# Patient Record
Sex: Male | Born: 1975
Health system: Southern US, Community
[De-identification: ages and names within clinical notes are randomized; demographics above are authoritative.]

## PROBLEM LIST (undated history)

## (undated) DIAGNOSIS — I10 Essential (primary) hypertension: Secondary | ICD-10-CM

## (undated) HISTORY — PX: APPENDECTOMY: SHX54

---

## 2005-10-11 ENCOUNTER — Emergency Department: Payer: Self-pay | Admitting: Emergency Medicine

## 2007-05-17 ENCOUNTER — Ambulatory Visit: Payer: Self-pay | Admitting: Family Medicine

## 2014-04-10 ENCOUNTER — Ambulatory Visit: Payer: Self-pay | Admitting: Surgery

## 2014-06-11 LAB — SURGICAL PATHOLOGY

## 2014-06-17 NOTE — Op Note (Signed)
PATIENT NAME:  Duane Thompson, Aylan W MR#:  [id number] DATE OF BIRTH:  1976-01-20  DATE OF PROCEDURE:  04/10/2014  PREOPERATIVE DIAGNOSIS: Acute appendicitis.   POSTOPERATIVE DIAGNOSIS: Acute appendicitis.   PROCEDURE PERFORMED: Laparoscopic appendectomy.   ANESTHESIA: General.   ESTIMATED BLOOD LOSS: 25 mL.   COMPLICATIONS: None.   SPECIMEN: Appendix.   INDICATION FOR SURGERY: Mr. Marissa CalamityBallard is a pleasant, 39 year old male, who presents with acute onset of right lower quadrant pain and a CT scan concerning for acute appendicitis. He was brought to the operating room for a laparoscopic appendectomy.   DETAILS OF PROCEDURE AS FOLLOWS: Informed consent was obtained. Mr. Marissa CalamityBallard was brought to the operating room and his abdomen was prepped and draped in standard sterile fashion. A timeout was then performed correctly identifying patient name, operative site and procedure to be performed.   A transverse supraumbilical incision was made and deepened down to the fascia. The fascia was incised and two stay sutures were placed in the fasciotomy. A Hasson trocar was placed in the abdomen and the abdomen was insufflated. Under direct vision, a right lower quadrant and suprapubic, 5 mm trocars were placed. The appendix was visualized. It was visibly inflamed and also scarred to the abdominal wall, and adhesions were taken down and the right colon was mobilized slightly until I could place a [place] dissector through the mesoappendix at the base of the colon. I then transected the colon with the endoscopic stapler. I then went across the mesoappendix with 2 staple loads. The appendix was then brought out through an Endo Catch bag through the umbilical incision. I then obtained hemostasis carefully.   After adequate hemostasis, the abdomen was desufflated. All trocars were taken out under direct visualization, and the supraumbilical fascia was closed with figure-of-eight 0 Vicryl. All port sites were closed with  interrupted 4-0 Monocryl and deep dermal sutures. Steri-Strips, Telfa and Tegaderm were used to complete the dressing. The patient was then awoken, extubated and brought to the postanesthesia care unit. There were no immediate complications.   Needle, sponge, and instrument counts were correct at the end of the procedure.    ____________________________ Si Raiderhristopher A. Chanae Gemma, MD cal:JT D: 04/11/2014 21:27:00 ET T: 04/12/2014 12:30:47 ET JOB#: [id number]  cc: Cristal Deerhristopher A. Archer Moist, MD, <Dictator> Jarvis NewcomerHRISTOPHER A Anetta Olvera MD ELECTRONICALLY SIGNED 04/24/2014 11:25

## 2014-06-17 NOTE — H&P (Signed)
   Subjective/Chief Complaint RLQ pain x 1 day, anorexia   History of Present Illness Mr. Duane Thompson is a pleasant 39 yo M who presents with 1 day of RLQ pain.  Began acutely.  Had episode of appendicitis in 2009 which was treated successfully with antibiotics.  Pain began initially diffusely, now in RLQ.  + fevers/chills.  + nausea, regular BM.   Past History H/o vasectomy H/o orthopedic surgeries   Code Status Full Code   ALLERGIES:  Amoxicillin: Hives  Family and Social History:  Family History Coronary Artery Disease  Hypertension  Diabetes Mellitus  Cancer   Social History negative tobacco, positive ETOH, negative Illicit drugs, Social EtOH   Place of Living Home   Review of Systems:  Subjective/Chief Complaint RLQ pain, fevers/chills   Fever/Chills Yes   Cough No   Sputum No   Abdominal Pain Yes   Diarrhea No   Constipation No   Nausea/Vomiting Yes   SOB/DOE No   Chest Pain No   Dysuria No   Tolerating Diet Yes  Nauseated  + anorexia   Physical Exam:  GEN well developed, well nourished, no acute distress   HEENT pink conjunctivae, PERRL, hearing intact to voice, good dentition   RESP normal resp effort  clear BS  no use of accessory muscles   CARD regular rate  no murmur  no thrills   ABD positive tenderness  no hernia  soft  normal BS  + RLQ tenderness   EXTR negative cyanosis/clubbing, negative edema   SKIN normal to palpation, No rashes, No ulcers   NEURO cranial nerves intact, negative rigidity, negative tremor, follows commands, strength:, motor/sensory function intact   PSYCH A+O to time, place, person, good insight    Assessment/Admission Diagnosis RLQ pain, fevers,chills, anorexia.  Clinically and radiographic appendicitis.   Plan NPO, IV abx.  Plan on laparoscopic appendectomy.  Have discussed with patient who would like to proceed.   Electronic Signatures: Jarvis NewcomerLundquist, Mickael Mcnutt A (MD)  (Signed [phone]-Feb-16 17:15)  Authored: CHIEF  COMPLAINT and HISTORY, ALLERGIES, FAMILY AND SOCIAL HISTORY, REVIEW OF SYSTEMS, PHYSICAL EXAM, ASSESSMENT AND PLAN   Last Updated: 23-Feb-16 17:15 by Jarvis NewcomerLundquist, Joanny Dupree A (MD)

## 2016-01-07 IMAGING — CT CT ABD-PELV W/ CM
2 of 4 series · 16 of 46 positions shown, 18 images · IV contrast (omnipaque)
Comparison: None

CLINICAL DATA: Severe right lower quadrant pain for 1 day

EXAM:
CT ABDOMEN AND PELVIS WITH CONTRAST
TECHNIQUE: Multidetector CT imaging of the abdomen and pelvis was performed
using the standard protocol following bolus administration of
intravenous contrast.
CONTRAST:  100 cc Omnipaque 300

[Series 2: routine abd pel with · axial · 0.70mm/px · z∈[-503,-23]mm · 13 of 106 slices shown, 15 images]
[im 5/106  soft-tissue]
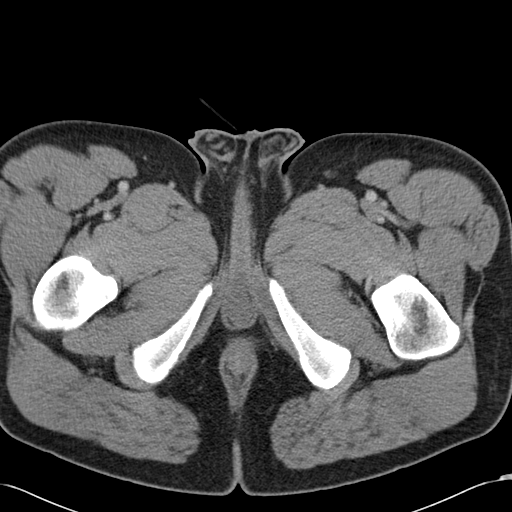
[im 5/106  bone]
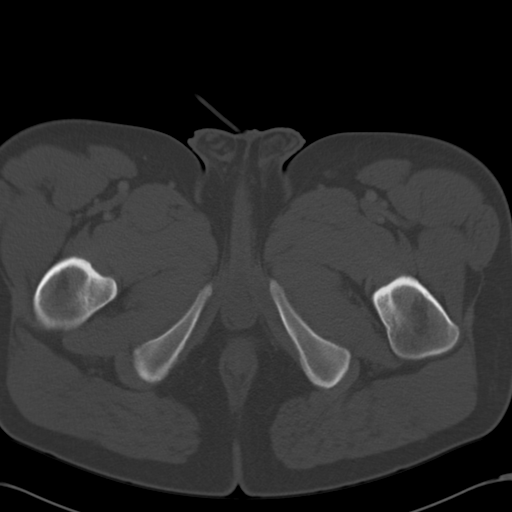
[im 13/106  soft-tissue]
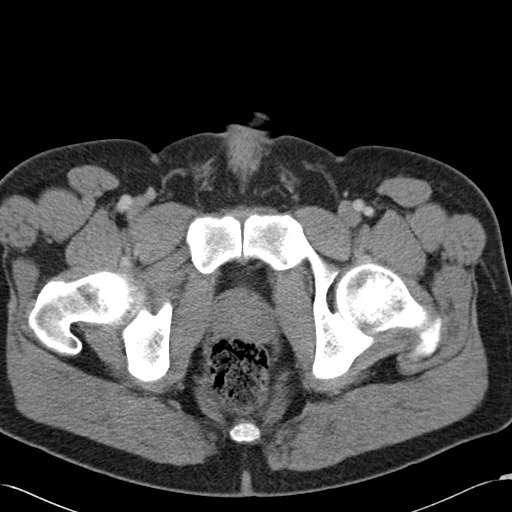
[im 22/106  soft-tissue]
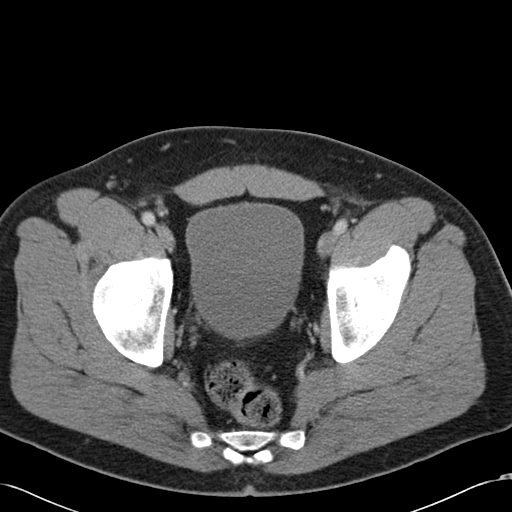
[im 30/106  soft-tissue]
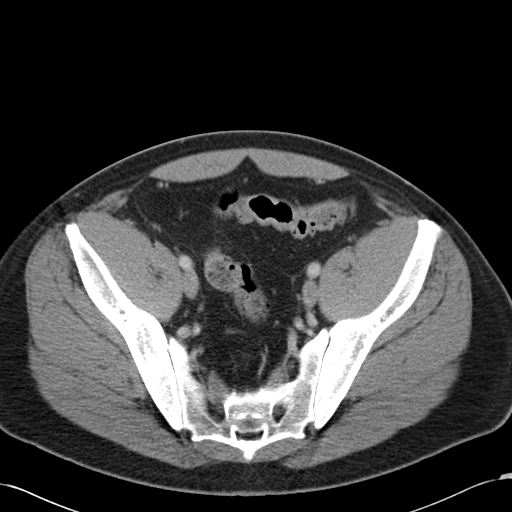
[im 38/106  soft-tissue]
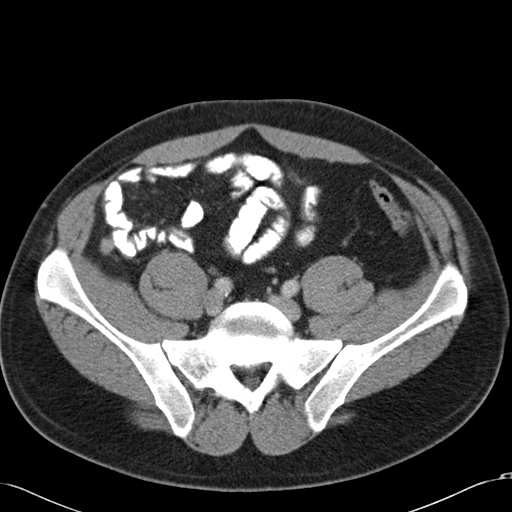
[im 47/106  soft-tissue]
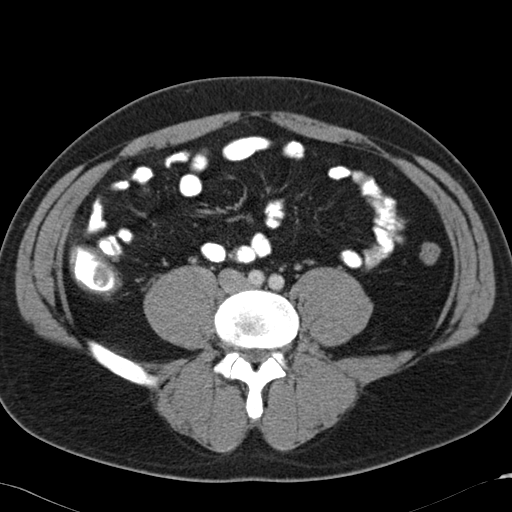
[im 55/106  soft-tissue]
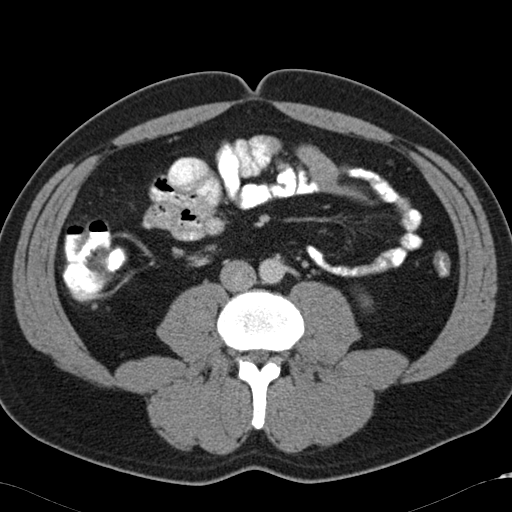
[im 59/106  soft-tissue]
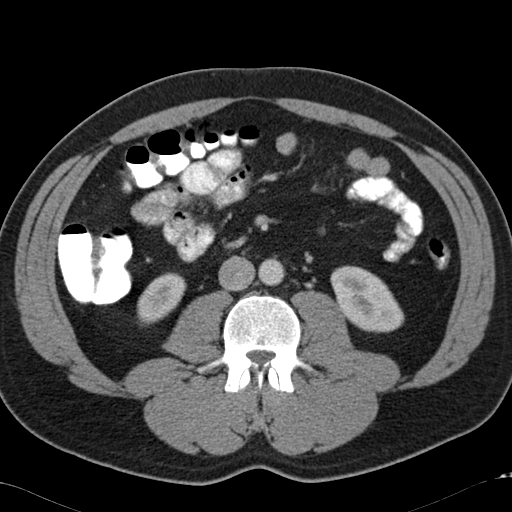
[im 68/106  soft-tissue]
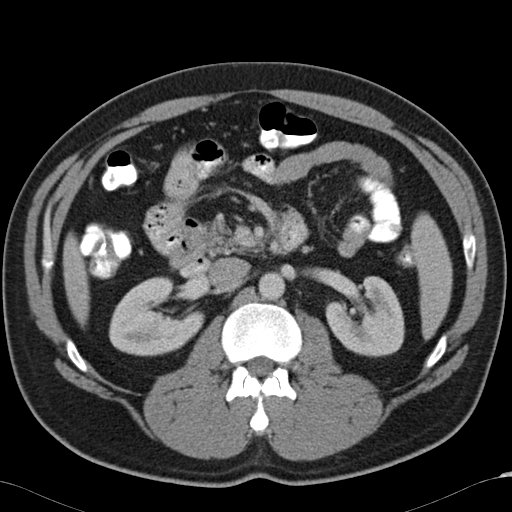
[im 68/106  bone]
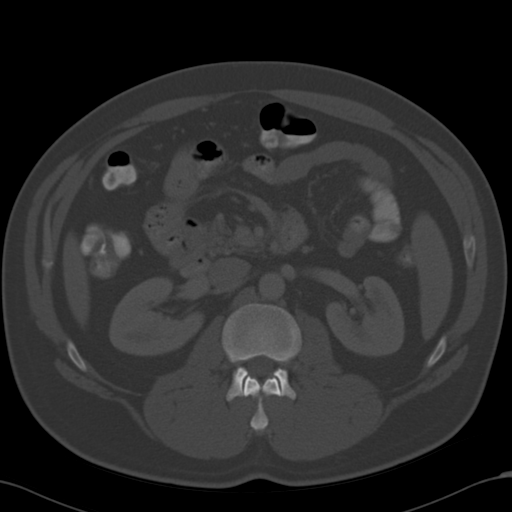
[im 76/106  soft-tissue]
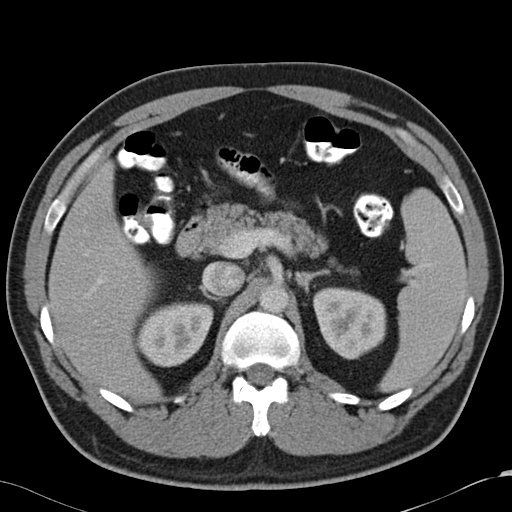
[im 85/106  soft-tissue]
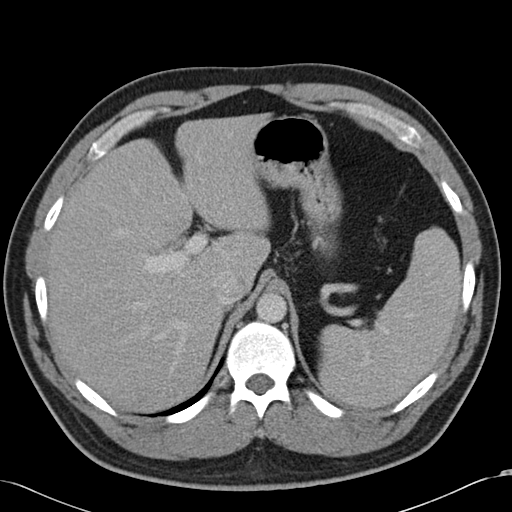
[im 93/106  soft-tissue]
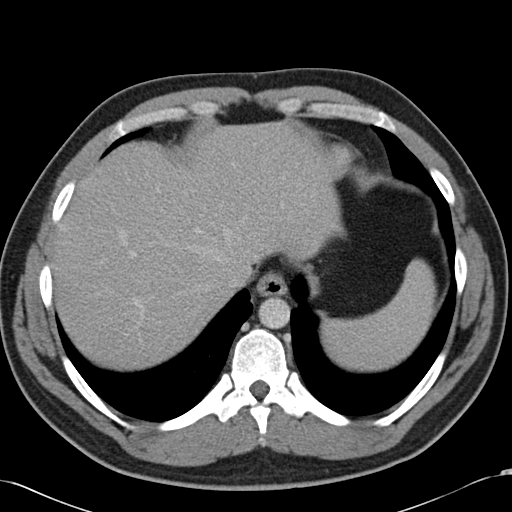
[im 101/106  soft-tissue]
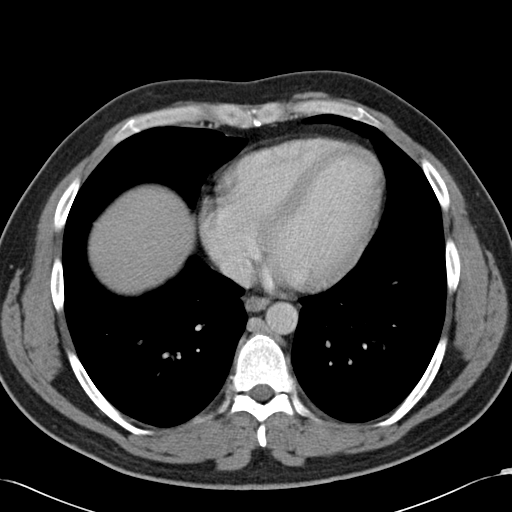

[Series 5: cor routine abd pel with · coronal · 0.71mm/px · 3 of 141 slices shown]
[im 47/141  soft-tissue]
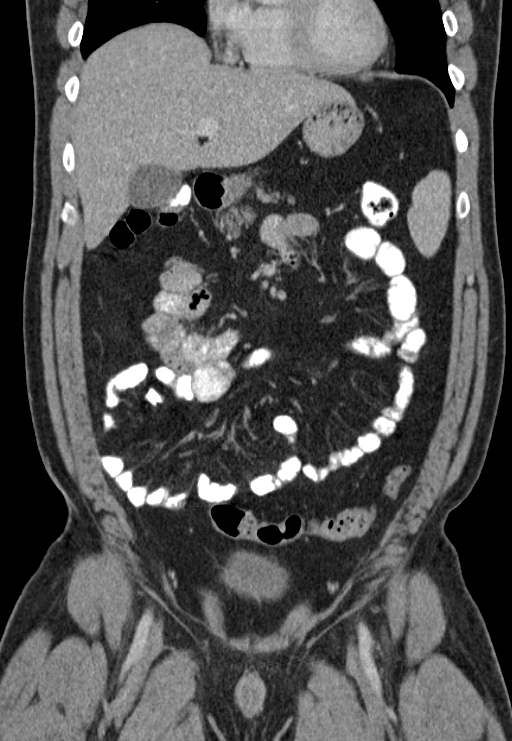
[im 63/141  soft-tissue]
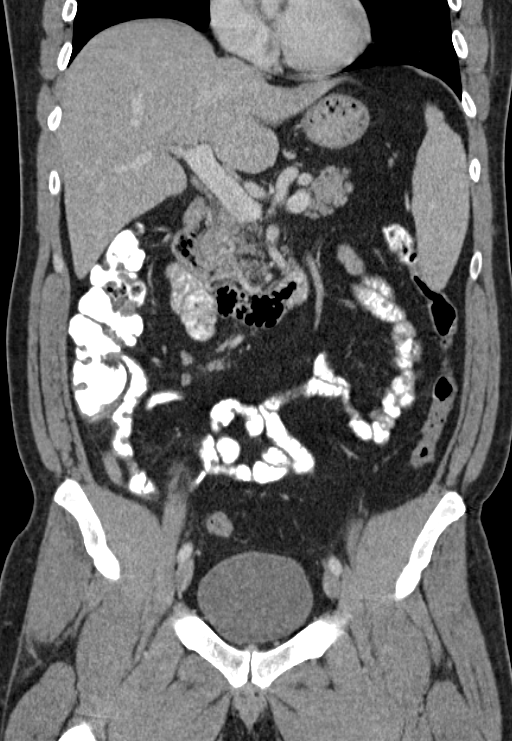
[im 78/141  soft-tissue]
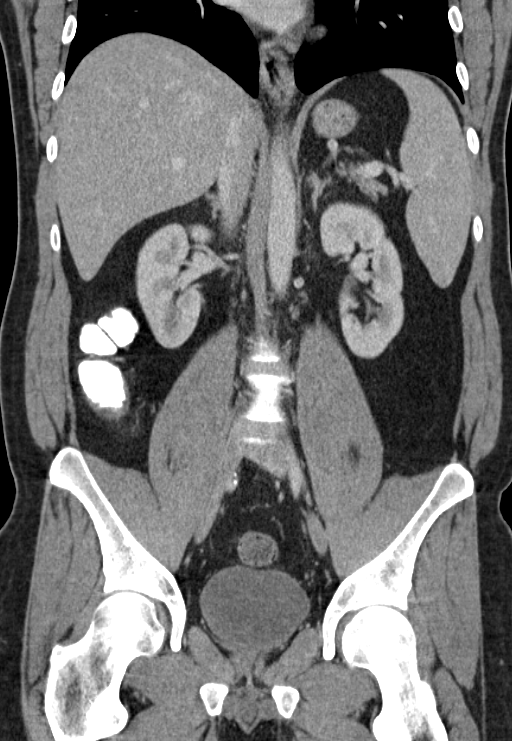

[16 of 46 positions shown; findings below may reference images not displayed]

FINDINGS: The lung bases are clear. The liver enhances with no focal
abnormality and no ductal dilatation is seen. No calcified
gallstones are seen. The pancreas is normal in size in the
pancreatic duct is not dilated. The adrenal glands and spleen are
unremarkable. The stomach is decompressed. The kidneys enhance with
no mass or hydronephrosis and no calculi are seen. The abdominal
aorta is normal in caliber. No adenopathy is noted.

The appendix is prominent measuring up to 11 mm in diameter
proximally. There is some strandiness of the perippendiceal fat
planes, and these findings are consistent with early appendicitis.
No complicating features are noted. There is some edema of the base
of the cecum as well at the orifice of the appendix. The terminal
ileum is unremarkable.

The urinary bladder is moderately urine distended with no
abnormality noted. The prostate is normal in size. No free fluid is
seen within the pelvis. The lumbar vertebrae are normal alignment.
IMPRESSION: 1. Prominent appendix measuring up to 11 mm in diameter with mild
strandiness of the periappendiceal fat planes, consistent with mild
early appendicitis. No complicating features are noted.

## 2018-06-16 ENCOUNTER — Other Ambulatory Visit: Payer: Self-pay

## 2018-06-16 ENCOUNTER — Ambulatory Visit (HOSPITAL_COMMUNITY)
Admission: EM | Admit: 2018-06-16 | Discharge: 2018-06-16 | Disposition: A | Discharge status: Home / Self Care | Payer: 59 | Attending: Orthopedic Surgery | Admitting: Orthopedic Surgery

## 2018-06-16 ENCOUNTER — Ambulatory Visit: Admit: 2018-06-16 | Payer: 59 | Admitting: Orthopedic Surgery

## 2018-06-16 ENCOUNTER — Encounter (HOSPITAL_COMMUNITY): Payer: Self-pay | Admitting: Anesthesiology

## 2018-06-16 ENCOUNTER — Emergency Department (HOSPITAL_COMMUNITY): Payer: 59 | Admitting: Anesthesiology

## 2018-06-16 ENCOUNTER — Ambulatory Visit
Admission: EM | Admit: 2018-06-16 | Discharge: 2018-06-16 | Disposition: A | Discharge status: Other Acute Inpatient Hospital | Payer: 59

## 2018-06-16 ENCOUNTER — Emergency Department (HOSPITAL_COMMUNITY): Payer: 59

## 2018-06-16 ENCOUNTER — Encounter (HOSPITAL_COMMUNITY): Admission: EM | Disposition: A | Discharge status: Home / Self Care | Payer: Self-pay | Source: Home / Self Care

## 2018-06-16 DIAGNOSIS — S68610A Complete traumatic transphalangeal amputation of right index finger, initial encounter: Secondary | ICD-10-CM | POA: Diagnosis not present

## 2018-06-16 DIAGNOSIS — W230XXA Caught, crushed, jammed, or pinched between moving objects, initial encounter: Secondary | ICD-10-CM | POA: Insufficient documentation

## 2018-06-16 DIAGNOSIS — Z880 Allergy status to penicillin: Secondary | ICD-10-CM | POA: Insufficient documentation

## 2018-06-16 DIAGNOSIS — S68119A Complete traumatic metacarpophalangeal amputation of unspecified finger, initial encounter: Secondary | ICD-10-CM

## 2018-06-16 HISTORY — PX: I & D EXTREMITY: SHX5045

## 2018-06-16 SURGERY — IRRIGATION AND DEBRIDEMENT EXTREMITY
Anesthesia: General | Site: Finger | Laterality: Right

## 2018-06-16 MED ORDER — CEFAZOLIN SODIUM-DEXTROSE 2-4 GM/100ML-% IV SOLN: 2.0000 g | INTRAVENOUS | Status: DC

## 2018-06-16 MED ORDER — EPHEDRINE 5 MG/ML INJ
INTRAVENOUS | Status: AC
  Filled 2018-06-16: qty 10

## 2018-06-16 MED ORDER — ONDANSETRON HCL 4 MG/2ML IJ SOLN: 4.0000 mg | Freq: Once | INTRAMUSCULAR | Status: DC

## 2018-06-16 MED ORDER — SULFAMETHOXAZOLE-TRIMETHOPRIM 800-160 MG PO TABS: 1.0000 | ORAL_TABLET | Freq: Two times a day (BID) | ORAL | 0 refills | Status: DC

## 2018-06-16 MED ORDER — CEFAZOLIN SODIUM-DEXTROSE 1-4 GM/50ML-% IV SOLN
1.0000 g | Freq: Once | INTRAVENOUS | Status: AC
  Administered 2018-06-16: 17:00:00 1 g via INTRAVENOUS
  Filled 2018-06-16: qty 50

## 2018-06-16 MED ORDER — MIDAZOLAM HCL 2 MG/2ML IJ SOLN
INTRAMUSCULAR | Status: AC
  Filled 2018-06-16: qty 2

## 2018-06-16 MED ORDER — 0.9 % SODIUM CHLORIDE (POUR BTL) OPTIME
TOPICAL | Status: DC | PRN
  Administered 2018-06-16 (×2): 1000 mL

## 2018-06-16 MED ORDER — CEFAZOLIN SODIUM-DEXTROSE 1-4 GM/50ML-% IV SOLN
INTRAVENOUS | Status: DC | PRN
  Administered 2018-06-16: 1 g via INTRAVENOUS

## 2018-06-16 MED ORDER — ARTIFICIAL TEARS OPHTHALMIC OINT
TOPICAL_OINTMENT | OPHTHALMIC | Status: AC
  Filled 2018-06-16: qty 3.5

## 2018-06-16 MED ORDER — PHENYLEPHRINE 40 MCG/ML (10ML) SYRINGE FOR IV PUSH (FOR BLOOD PRESSURE SUPPORT)
PREFILLED_SYRINGE | INTRAVENOUS | Status: AC
  Filled 2018-06-16: qty 10

## 2018-06-16 MED ORDER — PROPOFOL 10 MG/ML IV BOLUS
INTRAVENOUS | Status: DC | PRN
  Administered 2018-06-16: 200 mg via INTRAVENOUS

## 2018-06-16 MED ORDER — BUPIVACAINE HCL (PF) 0.25 % IJ SOLN
INTRAMUSCULAR | Status: DC | PRN
  Administered 2018-06-16: 10 mL

## 2018-06-16 MED ORDER — FENTANYL CITRATE (PF) 250 MCG/5ML IJ SOLN
INTRAMUSCULAR | Status: DC | PRN
  Administered 2018-06-16 (×4): 50 ug via INTRAVENOUS

## 2018-06-16 MED ORDER — HYDROCODONE-ACETAMINOPHEN 5-325 MG PO TABS: ORAL_TABLET | ORAL | 0 refills | Status: DC

## 2018-06-16 MED ORDER — TETANUS-DIPHTH-ACELL PERTUSSIS 5-2.5-18.5 LF-MCG/0.5 IM SUSP
0.5000 mL | Freq: Once | INTRAMUSCULAR | Status: AC
  Administered 2018-06-16: 0.5 mL via INTRAMUSCULAR
  Filled 2018-06-16: qty 0.5

## 2018-06-16 MED ORDER — BUPIVACAINE HCL 0.25 % IJ SOLN
10.0000 mL | Freq: Once | INTRAMUSCULAR | Status: AC
  Administered 2018-06-16: 10 mL
  Filled 2018-06-16: qty 10

## 2018-06-16 MED ORDER — SUCCINYLCHOLINE CHLORIDE 200 MG/10ML IV SOSY
PREFILLED_SYRINGE | INTRAVENOUS | Status: AC
  Filled 2018-06-16: qty 20

## 2018-06-16 MED ORDER — FENTANYL CITRATE (PF) 100 MCG/2ML IJ SOLN: 50.0000 ug | Freq: Once | INTRAMUSCULAR | Status: DC

## 2018-06-16 MED ORDER — CEFAZOLIN SODIUM 1 G IJ SOLR
INTRAMUSCULAR | Status: AC
  Filled 2018-06-16: qty 20

## 2018-06-16 MED ORDER — BUPIVACAINE HCL (PF) 0.25 % IJ SOLN
INTRAMUSCULAR | Status: AC
  Filled 2018-06-16: qty 30

## 2018-06-16 MED ORDER — LIDOCAINE HCL (CARDIAC) PF 100 MG/5ML IV SOSY
PREFILLED_SYRINGE | INTRAVENOUS | Status: DC | PRN
  Administered 2018-06-16: 60 mg via INTRATRACHEAL

## 2018-06-16 MED ORDER — LIDOCAINE 2% (20 MG/ML) 5 ML SYRINGE
INTRAMUSCULAR | Status: AC
  Filled 2018-06-16: qty 10

## 2018-06-16 MED ORDER — PROPOFOL 10 MG/ML IV BOLUS
INTRAVENOUS | Status: AC
  Filled 2018-06-16: qty 40

## 2018-06-16 MED ORDER — ONDANSETRON HCL 4 MG/2ML IJ SOLN
INTRAMUSCULAR | Status: AC
  Filled 2018-06-16: qty 2

## 2018-06-16 MED ORDER — SODIUM CHLORIDE (PF) 0.9 % IJ SOLN
INTRAMUSCULAR | Status: AC
  Filled 2018-06-16: qty 10

## 2018-06-16 MED ORDER — FENTANYL CITRATE (PF) 250 MCG/5ML IJ SOLN
INTRAMUSCULAR | Status: AC
  Filled 2018-06-16: qty 5

## 2018-06-16 MED ORDER — POVIDONE-IODINE 10 % EX SWAB: 2.0000 "application " | Freq: Once | CUTANEOUS | Status: DC

## 2018-06-16 MED ORDER — FENTANYL CITRATE (PF) 100 MCG/2ML IJ SOLN: 25.0000 ug | INTRAMUSCULAR | Status: DC | PRN

## 2018-06-16 MED ORDER — SUCCINYLCHOLINE CHLORIDE 20 MG/ML IJ SOLN
INTRAMUSCULAR | Status: DC | PRN
  Administered 2018-06-16: 120 mg via INTRAVENOUS

## 2018-06-16 MED ORDER — LACTATED RINGERS IV SOLN
INTRAVENOUS | Status: DC | PRN
  Administered 2018-06-16: 21:00:00 via INTRAVENOUS

## 2018-06-16 MED ORDER — MIDAZOLAM HCL 5 MG/5ML IJ SOLN
INTRAMUSCULAR | Status: DC | PRN
  Administered 2018-06-16: 2 mg via INTRAVENOUS

## 2018-06-16 MED ORDER — CHLORHEXIDINE GLUCONATE 4 % EX LIQD
60.0000 mL | Freq: Once | CUTANEOUS | Status: DC
  Filled 2018-06-16: qty 60

## 2018-06-16 MED ORDER — SODIUM CHLORIDE 0.9 % IR SOLN
Status: DC | PRN
  Administered 2018-06-16: 1000 mL

## 2018-06-16 SURGICAL SUPPLY — 51 items
BANDAGE ACE 3X5.8 VEL STRL LF (GAUZE/BANDAGES/DRESSINGS) ×3 IMPLANT
BANDAGE ACE 4X5 VEL STRL LF (GAUZE/BANDAGES/DRESSINGS) IMPLANT
BANDAGE COBAN LF 1.5X5 NS (GAUZE/BANDAGES/DRESSINGS) ×3 IMPLANT
BANDAGE COBAN STERILE 2 (GAUZE/BANDAGES/DRESSINGS) IMPLANT
BNDG ESMARK 4X9 LF (GAUZE/BANDAGES/DRESSINGS) ×3 IMPLANT
BNDG GAUZE ELAST 4 BULKY (GAUZE/BANDAGES/DRESSINGS) ×3 IMPLANT
CORDS BIPOLAR (ELECTRODE) ×3 IMPLANT
COVER SURGICAL LIGHT HANDLE (MISCELLANEOUS) ×3 IMPLANT
COVER WAND RF STERILE (DRAPES) ×3 IMPLANT
CUFF TOURNIQUET SINGLE 18IN (TOURNIQUET CUFF) ×3 IMPLANT
CUFF TOURNIQUET SINGLE 24IN (TOURNIQUET CUFF) IMPLANT
DECANTER SPIKE VIAL GLASS SM (MISCELLANEOUS) ×3 IMPLANT
DRAIN PENROSE 1/4X12 LTX STRL (WOUND CARE) IMPLANT
DRSG PAD ABDOMINAL 8X10 ST (GAUZE/BANDAGES/DRESSINGS) ×6 IMPLANT
DRSG XEROFORM 1X8 (GAUZE/BANDAGES/DRESSINGS) ×3 IMPLANT
FACESHIELD WRAPAROUND (MASK) ×3 IMPLANT
GAUZE SPONGE 4X4 12PLY STRL (GAUZE/BANDAGES/DRESSINGS) ×3 IMPLANT
GAUZE XEROFORM 1X8 LF (GAUZE/BANDAGES/DRESSINGS) ×3 IMPLANT
GLOVE BIO SURGEON STRL SZ7.5 (GLOVE) ×3 IMPLANT
GLOVE BIOGEL PI IND STRL 8 (GLOVE) ×1 IMPLANT
GLOVE BIOGEL PI INDICATOR 8 (GLOVE) ×2
GOWN STRL REUS W/ TWL LRG LVL3 (GOWN DISPOSABLE) ×1 IMPLANT
GOWN STRL REUS W/ TWL XL LVL3 (GOWN DISPOSABLE) ×1 IMPLANT
GOWN STRL REUS W/TWL LRG LVL3 (GOWN DISPOSABLE) ×2
GOWN STRL REUS W/TWL XL LVL3 (GOWN DISPOSABLE) ×2
KIT BASIN OR (CUSTOM PROCEDURE TRAY) ×3 IMPLANT
KIT TURNOVER KIT B (KITS) ×3 IMPLANT
LOOP VESSEL MAXI BLUE (MISCELLANEOUS) IMPLANT
MANIFOLD NEPTUNE II (INSTRUMENTS) ×3 IMPLANT
NEEDLE HYPO 25X1 1.5 SAFETY (NEEDLE) ×3 IMPLANT
NS IRRIG 1000ML POUR BTL (IV SOLUTION) ×3 IMPLANT
PACK ORTHO EXTREMITY (CUSTOM PROCEDURE TRAY) ×3 IMPLANT
PAD ARMBOARD 7.5X6 YLW CONV (MISCELLANEOUS) ×6 IMPLANT
SCRUB BETADINE 4OZ XXX (MISCELLANEOUS) ×3 IMPLANT
SET CYSTO W/LG BORE CLAMP LF (SET/KITS/TRAYS/PACK) IMPLANT
SOL PREP POV-IOD 4OZ 10% (MISCELLANEOUS) ×3 IMPLANT
SPLINT FINGER W/BULB (SOFTGOODS) ×3 IMPLANT
SPONGE LAP 4X18 RFD (DISPOSABLE) IMPLANT
SUT CHROMIC 5 0 P 3 (SUTURE) ×3 IMPLANT
SUT ETHILON 4 0 P 3 18 (SUTURE) IMPLANT
SUT ETHILON 4 0 PS 2 18 (SUTURE) IMPLANT
SUT MON AB 5-0 P3 18 (SUTURE) ×3 IMPLANT
SWAB COLLECTION DEVICE MRSA (MISCELLANEOUS) IMPLANT
SWAB CULTURE ESWAB REG 1ML (MISCELLANEOUS) IMPLANT
SYR CONTROL 10ML LL (SYRINGE) ×3 IMPLANT
TOWEL OR 17X26 10 PK STRL BLUE (TOWEL DISPOSABLE) ×3 IMPLANT
TUBE CONNECTING 12'X1/4 (SUCTIONS) ×1
TUBE CONNECTING 12X1/4 (SUCTIONS) ×2 IMPLANT
TUBE FEEDING ENTERAL 5FR 16IN (TUBING) IMPLANT
UNDERPAD 30X30 (UNDERPADS AND DIAPERS) ×3 IMPLANT
YANKAUER SUCT BULB TIP NO VENT (SUCTIONS) ×3 IMPLANT

## 2018-06-16 NOTE — Transfer of Care (Signed)
Immediate Anesthesia Transfer of Care Note  Patient: Duane Thompson  Procedure(s) Performed: IRRIGATION AND DEBRIDEMENT AND REVISION OF PARTIAL  AMPUTATION  RIGHT INDEX FINGER (Right Finger)  Patient Location: PACU  Anesthesia Type:General  Level of Consciousness: awake  Airway & Oxygen Therapy: Patient Spontanous Breathing and Patient connected to face mask oxygen  Post-op Assessment: Report given to RN and Post -op Vital signs reviewed and stable  Post vital signs: Reviewed and stable  Last Vitals:  Vitals Value Taken Time  BP    Temp    Pulse 110 06/16/2018  9:52 PM  Resp 16 06/16/2018  9:52 PM  SpO2 99 % 06/16/2018  9:52 PM  Vitals shown include unvalidated device data.  Last Pain:  Vitals:   06/16/18 1914  TempSrc: Oral  PainSc:          Complications: No apparent anesthesia complications

## 2018-06-16 NOTE — ED Provider Notes (Signed)
[facility] EMERGENCY DEPARTMENT Provider Note   CSN: [id number] Arrival date & time: 06/16/18  1419    History   Chief Complaint Chief Complaint  Patient presents with  . Finger Injury    HPI Duane Thompson is a 43 y.o. male.     The history is provided by the patient and medical records.  Hand Injury  Location:  Finger Finger location:  R index finger Injury: yes (full distal amputation)   Time since incident:  2 hours Mechanism of injury: amputation   Amputation:    Extent:  Complete   Mechanism:  Guillotine   Cause: trailer attachement mechanism.   Body part recovered: no     Reported condition of body part:  Unusable Pain details:    Quality:  Aching, throbbing and burning   Radiates to:  Does not radiate   Severity:  Severe   Onset quality:  Sudden   Duration:  2 hours   Timing:  Constant   Progression:  Unchanged Handedness:  Left-handed Foreign body present:  No foreign bodies Tetanus status:  Out of date Prior injury to area:  No Relieved by:  Nothing Worsened by:  Nothing Ineffective treatments:  None tried Associated symptoms: no back pain, no decreased range of motion, no fatigue, no fever, no muscle weakness, no neck pain, no numbness, no stiffness, no swelling and no tingling   Risk factors: no concern for non-accidental trauma, no known bone disorder, no frequent fractures and no recent illness     No past medical history on file.  There are no active problems to display for this patient.     The histories are not reviewed yet. Please review them in the "History" navigator section and refresh this SmartLink.      Home Medications    Prior to Admission medications   Not on File    Family History No family history on file.  Social History Social History   Tobacco Use  . Smoking status: Not on file  Substance Use Topics  . Alcohol use: Not on file  . Drug use: Not on file     Allergies   Patient  has no known allergies.   Review of Systems Review of Systems  Constitutional: Negative for fatigue and fever.  HENT: Negative.   Eyes: Negative.   Respiratory: Negative.  Negative for shortness of breath.   Cardiovascular: Negative.   Gastrointestinal: Negative.   Endocrine: Negative.   Genitourinary: Negative.   Musculoskeletal: Negative for back pain, neck pain and stiffness.  Skin: Positive for wound.  Neurological: Negative.   Hematological: Negative.      Physical Exam Updated Vital Signs BP (!) 199/113 (BP Location: Left Arm)   Pulse 86   Temp 98.1 F (36.7 C) (Oral)   Resp 18   SpO2 100%   Physical Exam Vitals signs and nursing note reviewed.  Constitutional:      General: He is not in acute distress.    Appearance: He is well-developed. He is not diaphoretic.     Comments: Appears uncomfortable  HENT:     Head: Normocephalic and atraumatic.  Eyes:     General: No scleral icterus.    Conjunctiva/sclera: Conjunctivae normal.  Neck:     Musculoskeletal: Normal range of motion and neck supple.  Cardiovascular:     Rate and Rhythm: Normal rate and regular rhythm.     Heart sounds: Normal heart sounds.  Pulmonary:     Effort: Pulmonary  effort is normal. No respiratory distress.     Breath sounds: Normal breath sounds.  Abdominal:     Palpations: Abdomen is soft.     Tenderness: There is no abdominal tenderness.  Musculoskeletal:     Comments: Complete amputation of the distal end of the left index finger above the DIP. Minimal bleeding.   Skin:    General: Skin is warm and dry.  Neurological:     Mental Status: He is alert and oriented to person, place, and time.  Psychiatric:        Behavior: Behavior normal.       ED Treatments / Results  Labs (all labs ordered are listed, but only abnormal results are displayed) Labs Reviewed - No data to display  EKG None  Radiology No results found.  Procedures .Nerve Block Date/Time: 06/16/2018 5:15  PM Performed by: Arthor CaptainHarris, Shelene Krage, PA-C Authorized by: Arthor CaptainHarris, Rajan Burgard, PA-C   Consent:    Consent obtained:  Verbal   Consent given by:  Patient   Risks discussed:  Allergic reaction, infection, swelling, unsuccessful block, pain and bleeding   Alternatives discussed:  No treatment Indications:    Indications:  Pain relief Location:    Body area:  Upper extremity   Upper extremity nerve:  Metacarpal   Laterality:  Right Pre-procedure details:    Skin preparation:  Povidone-iodine   Preparation: Patient was prepped and draped in usual sterile fashion   Skin anesthesia (see MAR for exact dosages):    Skin anesthesia method:  None Procedure details (see MAR for exact dosages):    Block needle gauge:  25 G   Anesthetic injected:  Bupivacaine 0.25% w/o epi   Steroid injected:  None   Additive injected:  None   Injection procedure:  Anatomic landmarks identified   Paresthesia:  None Post-procedure details:    Outcome:  Anesthesia achieved   Patient tolerance of procedure:  Tolerated well, no immediate complications   (including critical care time)  Medications Ordered in ED Medications  Tdap (BOOSTRIX) injection 0.5 mL (has no administration in time range)  ceFAZolin (ANCEF) IVPB 1 g/50 mL premix (has no administration in time range)  fentaNYL (SUBLIMAZE) injection 50 mcg (has no administration in time range)  ondansetron (ZOFRAN) injection 4 mg (has no administration in time range)  bupivacaine (MARCAINE) 0.25 % (with pres) injection 10 mL (has no administration in time range)         Initial Impression / Assessment and Plan / ED Course  I have reviewed the triage vital signs and the nursing notes.  Pertinent labs & imaging results that were available during my care of the patient were reviewed by me and considered in my medical decision making (see chart for details).  Clinical Course as of Jun 16 1638  Thu Jun 16, 2018  1640 DG Finger Index Right [AH]    Clinical  Course User Index [AH] Arthor CaptainHarris, Kanylah Muench, PA-C       Patient with traumatic amputation of the r 2nd digit.  Patient seen in consultation with PA Earney HamburgMichael Jeffrey He will need surgical closure and will go to the OR for repair. NPO  Final Clinical Impressions(s) / ED Diagnoses   Final diagnoses:  None    ED Discharge Orders    None       Arthor CaptainHarris, [place] Mullett, PA-C 06/18/18 1016    Tilden Fossaees, Elizabeth, MD 06/18/18 1056

## 2018-06-16 NOTE — Anesthesia Preprocedure Evaluation (Signed)
Anesthesia Evaluation  Patient identified by MRN, date of birth, ID band Patient awake    Reviewed: Allergy & Precautions, NPO status , Patient's Chart, lab work & pertinent test results  Airway Mallampati: II  TM Distance: >3 FB     Dental   Pulmonary    breath sounds clear to auscultation       Cardiovascular negative cardio ROS   Rhythm:Regular Rate:Normal     Neuro/Psych    GI/Hepatic negative GI ROS, Neg liver ROS,   Endo/Other  negative endocrine ROS  Renal/GU negative Renal ROS     Musculoskeletal   Abdominal   Peds  Hematology   Anesthesia Other Findings   Reproductive/Obstetrics                             Anesthesia Physical Anesthesia Plan  ASA: I and emergent  Anesthesia Plan: General   Post-op Pain Management:    Induction: Intravenous  PONV Risk Score and Plan: 2 and Ondansetron, Dexamethasone and Midazolam  Airway Management Planned: Oral ETT  Additional Equipment:   Intra-op Plan:   Post-operative Plan: Extubation in OR  Informed Consent: I have reviewed the patients History and Physical, chart, labs and discussed the procedure including the risks, benefits and alternatives for the proposed anesthesia with the patient or authorized representative who has indicated his/her understanding and acceptance.     Dental advisory given  Plan Discussed with: CRNA and Anesthesiologist  Anesthesia Plan Comments:         Anesthesia Quick Evaluation

## 2018-06-16 NOTE — ED Notes (Signed)
Patient transported to X-ray 

## 2018-06-16 NOTE — ED Notes (Signed)
Valuables with security envelope [id number] bag [phone]

## 2018-06-16 NOTE — Discharge Instructions (Addendum)

## 2018-06-16 NOTE — Anesthesia Postprocedure Evaluation (Signed)
Anesthesia Post Note  Patient: Duane Thompson  Procedure(s) Performed: IRRIGATION AND DEBRIDEMENT AND REVISION OF PARTIAL  AMPUTATION  RIGHT INDEX FINGER (Right Finger)     Patient location during evaluation: PACU Anesthesia Type: General Level of consciousness: awake Pain management: pain level controlled Vital Signs Assessment: post-procedure vital signs reviewed and stable Respiratory status: spontaneous breathing Cardiovascular status: stable Postop Assessment: no apparent nausea or vomiting Anesthetic complications: no    Last Vitals:  Vitals:   06/16/18 2156 06/16/18 2225  BP: (!) 162/99 (!) 156/104  Pulse: (!) 104   Resp: 15   Temp: 36.9 C 36.9 C  SpO2: 98% 95%    Last Pain:  Vitals:   06/16/18 2225  TempSrc:   PainSc: 0-No pain                 Bonna Steury

## 2018-06-16 NOTE — Op Note (Signed)
NAME: Duane Thompson MEDICAL RECORD NO: [id number] DATE OF BIRTH: 1975/06/30 FACILITY: [facility] LOCATION: [facility] OR PHYSICIAN: [name], MD   OPERATIVE REPORT   DATE OF PROCEDURE: 06/16/18    PREOPERATIVE DIAGNOSIS:   Right index fingertip amputation   POSTOPERATIVE DIAGNOSIS:   Right index fingertip amputation   PROCEDURE:   Revision amputation right index finger through distal phalanx   SURGEON:  Betha Loa, M.D.   ASSISTANT: none   ANESTHESIA:  General   INTRAVENOUS FLUIDS:  Per anesthesia flow sheet.   ESTIMATED BLOOD LOSS:  Minimal.   COMPLICATIONS:  None.   SPECIMENS:  none   TOURNIQUET TIME:   Right arm: 26 minutes at 250 mmHg   DISPOSITION:  Stable to PACU.   INDICATIONS: 43 year old left-hand-dominant male states he pinch off the tip of his right index finger on a trailer hitch earlier today.  He was seen at the emergency department.  Recommended revision amputation in the operating room. Risks, benefits and alternatives of surgery were discussed including the risks of blood loss, infection, damage to nerves, vessels, tendons, ligaments, bone for surgery, need for additional surgery, complications with wound healing, continued pain, stiffness.  He voiced understanding of these risks and elected to proceed.  OPERATIVE COURSE:  After being identified preoperatively by myself,  the patient and I agreed on the procedure and site of the procedure.  The surgical site was marked.  Surgical consent had been signed. He was given IV antibiotics as preoperative antibiotic prophylaxis. He was transferred to the operating room and placed on the operating table in supine position with the Right upper extremity on an arm board.  General anesthesia was induced by the anesthesiologist.  Right upper extremity was prepped and draped in normal sterile orthopedic fashion.  A surgical pause was performed between the surgeons, anesthesia, and operating room staff and all were in  agreement as to the patient, procedure, and site of procedure.  Tourniquet at the proximal aspect of the extremity was inflated to 250 mmHg after exsanguination of the arm with an Esmarch bandage.    The wound was explored.  There was some what appeared to be paint stuck to the distal phalanx.  This was removed by scraping with a knife.  The knife was then used to excise the remaining portion of germinal matrix and nailbed.  The soft tissues were mobilized.  The rondure was used to shorten the distal phalanx down to just at the level of the distal aspect of the FDP insertion.  This allowed the soft tissues to be covered over the end of the bone.  The wound was copiously irrigated with sterile saline.  The soft tissues were reapproximated using both 5-0 Monocryl and 5-0 chromic suture in an interrupted fashion.  Good reapproximation of soft tissues was obtained.  There is good contour to the fingertip.  The wound was dressed with sterile Xeroform 4 x 4 and wrapped with a Coban dressing lightly.  AlumaFoam splint was placed and wrapped lightly with Coban dressing.  A digital block was performed with quarter percent plain Marcaine to aid in postoperative analgesia.  The tourniquet was deflated at 26 minutes.  Fingertips were pink with brisk capillary refill after deflation of tourniquet.  The operative  drapes were broken down.  The patient was awoken from anesthesia safely.  He was transferred back to the stretcher and taken to PACU in stable condition.  I will see him back in the office in 1 week for  postoperative followup.  I will give him a prescription for Norco 5/325 1-2 tabs PO q6 hours prn pain, dispense # 20 and Bactrim DS 1 p.o. twice daily x7 days.   Betha LoaKevin Marjorie Deprey, MD Electronically signed, 06/16/18

## 2018-06-16 NOTE — Anesthesia Procedure Notes (Signed)
Procedure Name: Intubation Date/Time: 06/16/2018 9:10 PM Performed by: Claudina Lick, CRNA Pre-anesthesia Checklist: Patient identified, Emergency Drugs available, Suction available, Patient being monitored and Timeout performed Patient Re-evaluated:Patient Re-evaluated prior to induction Oxygen Delivery Method: Circle system utilized Preoxygenation: Pre-oxygenation with 100% oxygen Induction Type: IV induction, Rapid sequence and Cricoid Pressure applied Laryngoscope Size: Miller and 2 Grade View: Grade I Tube type: Oral Tube size: 7.5 mm Number of attempts: 1 Airway Equipment and Method: Stylet Placement Confirmation: ETT inserted through vocal cords under direct vision,  positive ETCO2 and breath sounds checked- equal and bilateral Secured at: 22 cm Tube secured with: Tape Dental Injury: Teeth and Oropharynx as per pre-operative assessment

## 2018-06-16 NOTE — ED Notes (Signed)
Called short stay number [phone], no answer

## 2018-06-16 NOTE — ED Notes (Signed)
Asked short stay to  Pick up pt and the will

## 2018-06-16 NOTE — Consult Note (Signed)
Reason for Consult:Index finger amputation Referring Physician: A Duane Thompson is an 43 y.o. male.  HPI: Duane was working with a trailer and had his index finger caught in the landing gear and had it sheared off. He came to the ED and hand surgery was consulted. He is LHD.  No past medical history on file.  No family history on file.  Social History:  has no history on file for tobacco, alcohol, and drug.  Allergies: No Known Allergies  Medications: I have reviewed the patient's current medications.  No results found for this or any previous visit (from the past 48 hour(s)).  No results found.  Review of Systems  Constitutional: Negative for weight loss.  HENT: Negative for ear discharge, ear pain, hearing loss and tinnitus.   Eyes: Negative for blurred vision, double vision, photophobia and pain.  Respiratory: Negative for cough, sputum production and shortness of breath.   Cardiovascular: Negative for chest pain.  Gastrointestinal: Negative for abdominal pain, nausea and vomiting.  Genitourinary: Negative for dysuria, flank pain, frequency and urgency.  Musculoskeletal: Positive for joint pain (Right index). Negative for back pain, falls, myalgias and neck pain.  Neurological: Negative for dizziness, tingling, sensory change, focal weakness, loss of consciousness and headaches.  Endo/Heme/Allergies: Does not bruise/bleed easily.  Psychiatric/Behavioral: Negative for depression, memory loss and substance abuse. The patient is not nervous/anxious.    Blood pressure (!) 199/113, pulse 86, temperature 98.1 F (36.7 C), temperature source Oral, resp. rate 18, SpO2 100 %. Physical Exam  Constitutional: He appears well-developed and well-nourished. No distress.  HENT:  Head: Normocephalic and atraumatic.  Eyes: Conjunctivae are normal. Right eye exhibits no discharge. Left eye exhibits no discharge. No scleral icterus.  Neck: Normal range of motion.   Cardiovascular: Normal rate and regular rhythm.  Respiratory: Effort normal. No respiratory distress.  Musculoskeletal:     Comments: Right shoulder, elbow, wrist, digits- Amputation tip index finger, TTP, no instability, no blocks to motion  Sens  Ax/R/M/U intact  Mot   Ax/ R/ PIN/ M/ AIN/ U intact  Rad 2+  Neurological: He is alert.  Skin: Skin is warm and dry. He is not diaphoretic.  Psychiatric: He has a normal mood and affect. His behavior is normal.        Assessment/Plan: Right index finger amputation -- Plan for revision amputation tonight by Dr. Merlyn Lot. NPO until then. Anticipate discharge after surgery.    Duane Caldron, PA-C Orthopedic Surgery [phone] 06/16/2018, 2:57 PM

## 2018-06-16 NOTE — ED Triage Notes (Signed)
Pt arrivers having "cutting tip of finger off with trailer landing". Bleeding is controlled at nurse first and wrapped with a self made bandage.

## 2018-06-16 NOTE — H&P (Signed)
Duane Thompson is an 43 y.o. male.   Chief Complaint: right index finger amputation HPI: 43 yo lhd male states he amputated distal aspect right index finger on trailer hitch earlier today.  Report no previous injury to right index finger and no other injury at this time.  Describes initial throbbing pain alleviated by compression and digital block.  Aggravated by motion.  Associated bleeding.  Xrays viewed and interpreted by me: 3 views right index finger show distal phalanx amputation proximal to tuft Labs reviewed: none  Allergies:  Allergies  Allergen Reactions  . Amoxicillin Hives    Did it involve swelling of the face/tongue/throat, SOB, or low BP? No Did it involve sudden or severe rash/hives, skin peeling, or any reaction on the inside of your mouth or nose? No Did you need to seek medical attention at a hospital or doctor's office? No When did it last happen?unknown If all above answers are "NO", may proceed with cephalosporin use.     History reviewed. No pertinent past medical history.  History reviewed. No pertinent surgical history.  Family History: History reviewed. No pertinent family history.  Social History:   has no history on file for tobacco, alcohol, and drug.  Medications: (Not in a hospital admission)   No results found for this or any previous visit (from the past 48 hour(s)).  Dg Finger Index Right  Result Date: 06/16/2018 CLINICAL DATA:  Amputation of right index finger. EXAM: RIGHT INDEX FINGER 2+V COMPARISON:  None. FINDINGS: There has been traumatic amputation of the distal tuft of the second distal phalanx and surrounding soft tissues. No radiopaque foreign body is noted. No other bony abnormality is noted. Joint spaces are intact. IMPRESSION: Status post traumatic amputation of distal tuft of second distal phalanx and surrounding soft tissues. Electronically Signed   By: Lupita Raider M.D.   On: 06/16/2018 15:58     A  comprehensive review of systems was negative. Review of Systems: No fevers, chills, night sweats, chest pain, shortness of breath, nausea, vomiting, diarrhea, constipation, easy bleeding or bruising, headaches, dizziness, vision changes, fainting.   Blood pressure (!) 168/99, pulse 77, temperature 99.2 F (37.3 C), temperature source Oral, resp. rate 18, SpO2 96 %.  General appearance: alert, cooperative and appears stated age Head: Normocephalic, without obvious abnormality, atraumatic Neck: supple, symmetrical, trachea midline Resp: clear to auscultation bilaterally Cardio: regular rate and rhythm Extremities: Intact sensation and capillary refill all digits.  +epl/fpl/io.  Amputation right index finger at proximal aspect of the nail.   Pulses: 2+ and symmetric Skin: Skin color, texture, turgor normal. No rashes or lesions Neurologic: Grossly normal Incision/Wound: amputation as above  Assessment/Plan Right index finger amputation.  Recommend revision amputation in OR.  Risks, benefits and alternatives of surgery were discussed including risks of blood loss, infection, damage to nerves/vessels/tendons/ligament/bone, failure of surgery, need for additional surgery, complication with wound healing, nonunion, malunion, stiffness.  He voiced understanding of these risks and elected to proceed.    Betha Loa 06/16/2018, 8:47 PM

## 2018-06-17 ENCOUNTER — Encounter (HOSPITAL_COMMUNITY): Payer: Self-pay | Admitting: Orthopedic Surgery

## 2019-04-27 ENCOUNTER — Telehealth: Payer: Self-pay

## 2019-04-27 NOTE — Telephone Encounter (Signed)
Opened by mistake.

## 2019-05-03 ENCOUNTER — Ambulatory Visit: Payer: 59 | Admitting: Family Medicine

## 2019-05-03 ENCOUNTER — Other Ambulatory Visit: Payer: Self-pay

## 2019-05-03 ENCOUNTER — Encounter: Payer: Self-pay | Admitting: Family Medicine

## 2019-05-03 VITALS — BP 140/100 | HR 76 | Ht 70.0 in | Wt 241.0 lb

## 2019-05-03 DIAGNOSIS — Z0289 Encounter for other administrative examinations: Secondary | ICD-10-CM | POA: Diagnosis not present

## 2019-05-03 DIAGNOSIS — Z111 Encounter for screening for respiratory tuberculosis: Secondary | ICD-10-CM | POA: Diagnosis not present

## 2019-05-03 DIAGNOSIS — Z7689 Persons encountering health services in other specified circumstances: Secondary | ICD-10-CM

## 2019-05-03 DIAGNOSIS — R030 Elevated blood-pressure reading, without diagnosis of hypertension: Secondary | ICD-10-CM

## 2019-05-03 NOTE — Progress Notes (Signed)
Date:  05/03/2019   Name:  Duane Thompson   DOB:  06-22-1975   MRN:  [id number]   Chief Complaint: Annual Exam (tb skin test)  Patient is a 44 year old male who presents for a comprehensive physical exam. The patient reports the following problems: elevated blood pressure. Health maintenance has been reviewed up to date.   No results found for: CREATININE, BUN, NA, K, CL, CO2 No results found for: CHOL, HDL, LDLCALC, LDLDIRECT, TRIG, CHOLHDL No results found for: TSH No results found for: HGBA1C No results found for: WBC, HGB, HCT, MCV, PLT No results found for: ALT, AST, GGT, ALKPHOS, BILITOT   Review of Systems  Constitutional: Negative for chills and fever.  HENT: Negative for drooling, ear discharge, ear pain, rhinorrhea, sinus pain and sore throat.   Respiratory: Negative for cough, shortness of breath and wheezing.   Cardiovascular: Negative for chest pain, palpitations and leg swelling.  Gastrointestinal: Negative for abdominal pain, blood in stool, constipation, diarrhea and nausea.  Endocrine: Negative for polydipsia, polyphagia and polyuria.  Genitourinary: Negative for dysuria, frequency, hematuria and urgency.  Musculoskeletal: Negative for back pain, myalgias and neck pain.  Skin: Negative for rash.  Allergic/Immunologic: Negative for environmental allergies.  Neurological: Negative for dizziness and headaches.  Hematological: Does not bruise/bleed easily.  Psychiatric/Behavioral: Negative for suicidal ideas. The patient is not nervous/anxious.     There are no problems to display for this patient.   Allergies  Allergen Reactions  . Amoxicillin Hives    Did it involve swelling of the face/tongue/throat, SOB, or low BP? No Did it involve sudden or severe rash/hives, skin peeling, or any reaction on the inside of your mouth or nose? No Did you need to seek medical attention at a hospital or doctor's office? No When did it last happen?unknown If  all above answers are "NO", may proceed with cephalosporin use.     Past Surgical History:  Procedure Laterality Date  . I & D EXTREMITY Right 06/16/2018   Procedure: IRRIGATION AND DEBRIDEMENT AND REVISION OF PARTIAL  AMPUTATION  RIGHT INDEX FINGER;  Surgeon: Betha Loa, MD;  Location: [facility];  Service: Orthopedics;  Laterality: Right;    Social History   Tobacco Use  . Smoking status: Never Smoker  . Smokeless tobacco: Former Engineer, water Use Topics  . Alcohol use: Yes    Comment: occas.  . Drug use: Never     Medication list has been reviewed and updated.  No outpatient medications have been marked as taking for the 05/03/19 encounter (Office Visit) with [name], MD.    [facility] 2/9 Scores 05/03/2019  PHQ - 2 Score 0  PHQ- 9 Score 0    BP Readings from Last 3 Encounters:  05/03/19 (!) 140/100  06/16/18 (!) 156/104    Physical Exam Vitals and nursing note reviewed.  HENT:     Head: Normocephalic.     Right Ear: Tympanic membrane, ear canal and external ear normal. There is no impacted cerumen.     Left Ear: Tympanic membrane, ear canal and external ear normal. There is no impacted cerumen.     Nose: Nose normal. No congestion or rhinorrhea.     Mouth/Throat:     Mouth: Mucous membranes are moist.  Eyes:     General: No scleral icterus.       Right eye: No discharge.        Left eye: No discharge.     Conjunctiva/sclera:  Conjunctivae normal.     Pupils: Pupils are equal, round, and reactive to light.  Neck:     Thyroid: No thyromegaly.     Vascular: No carotid bruit or JVD.     Trachea: No tracheal deviation.  Cardiovascular:     Rate and Rhythm: Normal rate and regular rhythm.     Heart sounds: Normal heart sounds. No murmur. No friction rub. No gallop.   Pulmonary:     Effort: No respiratory distress.     Breath sounds: Normal breath sounds. No wheezing, rhonchi or rales.  Abdominal:     General: Bowel sounds are normal.     Palpations: Abdomen  is soft. There is no mass.     Tenderness: There is no abdominal tenderness. There is no guarding or rebound.  Musculoskeletal:        General: No tenderness. Normal range of motion.     Cervical back: Normal range of motion and neck supple.  Lymphadenopathy:     Cervical: No cervical adenopathy.  Skin:    General: Skin is warm.     Findings: No rash.  Neurological:     Mental Status: He is alert and oriented to person, place, and time.     Cranial Nerves: No cranial nerve deficit.     Deep Tendon Reflexes: Reflexes are normal and symmetric.     Wt Readings from Last 3 Encounters:  05/03/19 241 lb (109.3 kg)    BP (!) 140/100   Pulse 76   Ht 5\' 10"  (1.778 m)   Wt 241 lb (109.3 kg)   BMI 34.58 kg/m   Assessment and Plan: 1. Encounter to establish care Patient to reestablish care for primary care physician.  Patient notes no current issues and is up-to-date on most maintenance concerns.  2. 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  Exam notes no contraindication to employment or suggestion of communicable disease.  Tuberculin skin test applied.  3. Elevated blood-pressure reading without diagnosis of hypertension Patient has elevated blood pressure reading but attributes this to being nervous.  Patient is going to be receiving blood pressure readings at work from a colleague and will be transmitting these readings to [place].  4. Screening-pulmonary TB Circular skin test applied and will be read in 48 hours. - PPD

## 2019-05-03 NOTE — Patient Instructions (Signed)

## 2019-05-05 LAB — TB SKIN TEST
Induration: 0 mm
TB Skin Test: NEGATIVE

## 2020-03-14 IMAGING — DX RIGHT INDEX FINGER 2+V
3 series · 3 of 3 positions shown · non-contrast
Comparison: None.

CLINICAL DATA: Amputation of right index finger.

EXAM:
RIGHT INDEX FINGER 2+V

[finger ap]
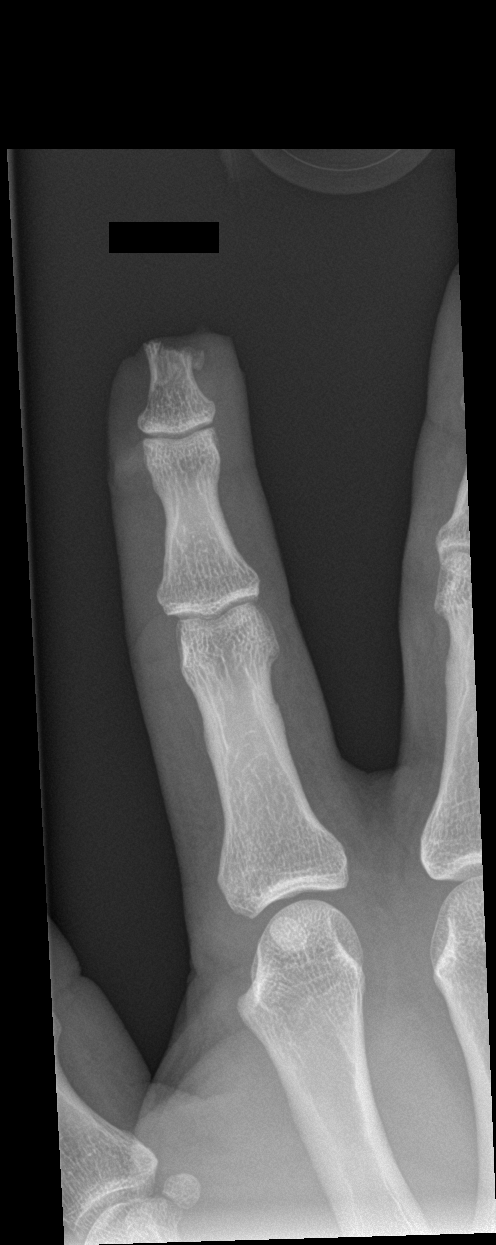

[finger obl]
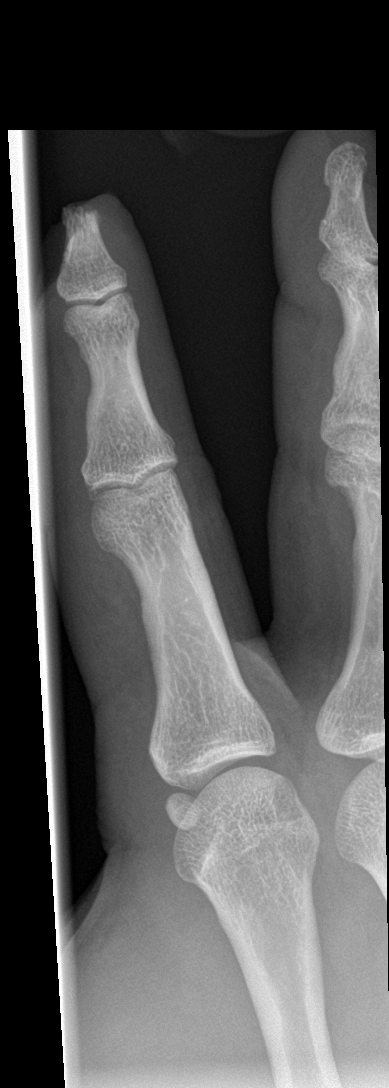

[finger lat]
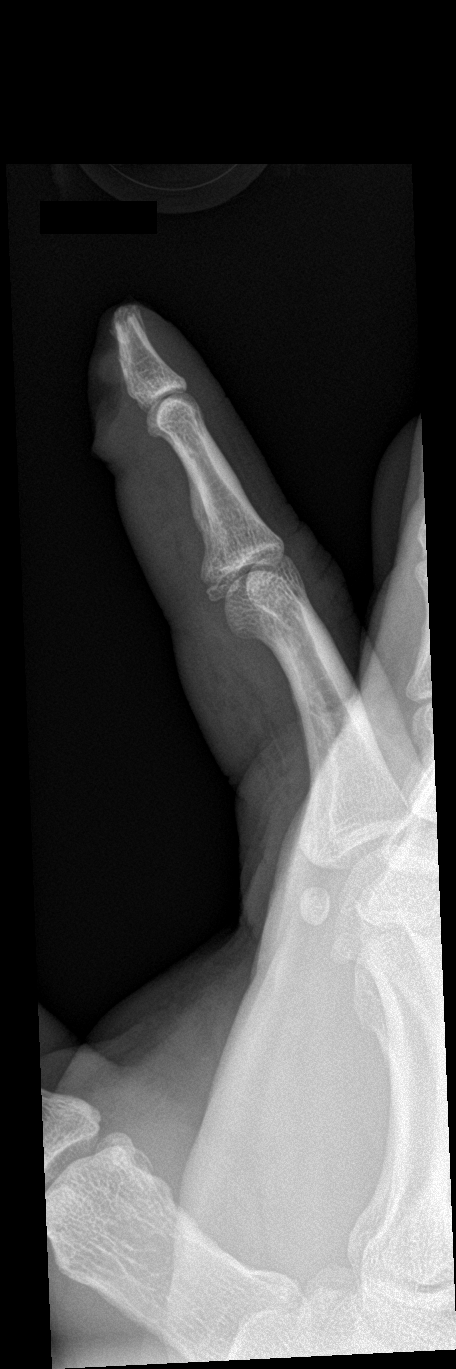

[3 of 3 positions shown; findings below may reference images not displayed]

FINDINGS: There has been traumatic amputation of the distal tuft of the second
distal phalanx and surrounding soft tissues. No radiopaque foreign
body is noted. No other bony abnormality is noted. Joint spaces are
intact.
IMPRESSION: Status post traumatic amputation of distal tuft of second distal
phalanx and surrounding soft tissues.

## 2022-01-01 ENCOUNTER — Ambulatory Visit: Payer: Self-pay

## 2022-01-01 NOTE — Telephone Encounter (Signed)
Patient called, left VM to return the call to the office to discuss symptoms with a nurse.  Summary: bp   Patient called in say bp was 180/120, no other symptoms and he couldn't do any appt today because of therapy classes he has and no appt available until Dec 7 with Dr Yetta Barre.  Please call back to discuss

## 2022-01-01 NOTE — Telephone Encounter (Signed)
Message from [place] sent at 01/01/2022  8:55 AM EST  Summary: bp   Patient called in say bp was 180/120, no other symptoms and he couldn't do any appt today because of therapy classes he has and no appt available until Dec 7 with Dr Yetta Barre.  Please call back to discuss        Second attempt to call pt. LM on VM to call back.

## 2022-01-01 NOTE — Telephone Encounter (Signed)
Third attempt to call patient- number provided is not working number. Left message to call office on number listed in chart

## 2022-01-02 ENCOUNTER — Ambulatory Visit (INDEPENDENT_AMBULATORY_CARE_PROVIDER_SITE_OTHER): Payer: BC Managed Care – PPO | Admitting: Family Medicine

## 2022-01-02 ENCOUNTER — Encounter: Payer: Self-pay | Admitting: Family Medicine

## 2022-01-02 VITALS — BP 178/120 | Ht 70.0 in | Wt 240.0 lb

## 2022-01-02 DIAGNOSIS — I10 Essential (primary) hypertension: Secondary | ICD-10-CM

## 2022-01-02 MED ORDER — AMLODIPINE BESYLATE 5 MG PO TABS: 5.0000 mg | ORAL_TABLET | Freq: Every day | ORAL | 0 refills | Status: DC

## 2022-01-02 NOTE — Telephone Encounter (Signed)
Copied from CRM [id number]. Topic: General - Call Back - No Documentation >> Jan 02, 2022  8:23 AM Duane Thompson wrote: Reason for CRM: call back [phone]. ask for him.pt aware to be ready for the call in 10 min

## 2022-01-02 NOTE — Patient Instructions (Signed)

## 2022-01-02 NOTE — Progress Notes (Signed)
Date:  01/02/2022   Name:  Duane Thompson   DOB:  1975-04-18   MRN:  [id number]   Chief Complaint: No chief complaint on file.  HPI  No results found for: "NA", "K", "CO2", "GLUCOSE", "BUN", "CREATININE", "CALCIUM", "EGFR", "GFRNONAA" No results found for: "CHOL", "HDL", "LDLCALC", "LDLDIRECT", "TRIG", "CHOLHDL" No results found for: "TSH" No results found for: "HGBA1C" No results found for: "WBC", "HGB", "HCT", "MCV", "PLT" No results found for: "ALT", "AST", "GGT", "ALKPHOS", "BILITOT" No results found for: "25OHVITD2", "25OHVITD3", "VD25OH"   Review of Systems  Constitutional:  Negative for chills and fever.  HENT:  Negative for drooling, ear discharge, ear pain and sore throat.   Respiratory:  Negative for cough, shortness of breath and wheezing.   Cardiovascular:  Negative for chest pain, palpitations and leg swelling.  Gastrointestinal:  Negative for abdominal pain, blood in stool, constipation, diarrhea and nausea.  Endocrine: Negative for polydipsia.  Genitourinary:  Negative for dysuria, frequency, hematuria and urgency.  Musculoskeletal:  Negative for back pain, myalgias and neck pain.  Skin:  Negative for rash.  Allergic/Immunologic: Negative for environmental allergies.  Neurological:  Negative for dizziness and headaches.  Hematological:  Does not bruise/bleed easily.  Psychiatric/Behavioral:  Negative for suicidal ideas. The patient is not nervous/anxious.     There are no problems to display for this patient.   Allergies  Allergen Reactions   Amoxicillin Hives    Did it involve swelling of the face/tongue/throat, SOB, or low BP? No Did it involve sudden or severe rash/hives, skin peeling, or any reaction on the inside of your mouth or nose? No Did you need to seek medical attention at a hospital or doctor's office? No When did it last happen?    unknown   If all above answers are "NO", may proceed with cephalosporin use.     Past Surgical  History:  Procedure Laterality Date   I & D EXTREMITY Right 06/16/2018   Procedure: IRRIGATION AND DEBRIDEMENT AND REVISION OF PARTIAL  AMPUTATION  RIGHT INDEX FINGER;  Surgeon: Leanora Cover, MD;  Location: [facility];  Service: Orthopedics;  Laterality: Right;    Social History   Tobacco Use   Smoking status: Never   Smokeless tobacco: Former    Quit date: 02/17/2003  Substance Use Topics   Alcohol use: Yes    Comment: occas.   Drug use: Never     Medication list has been reviewed and updated.  No outpatient medications have been marked as taking for the 01/02/22 encounter (Office Visit) with Juline Patch, MD.       01/02/2022    3:57 PM 05/03/2019   10:29 AM  GAD 7 : Generalized Anxiety Score  Nervous, Anxious, on Edge 0 0  Control/stop worrying 0 0  Worry too much - different things 0 0  Trouble relaxing 0 0  Restless 0 0  Easily annoyed or irritable 0 0  Afraid - awful might happen 0 0  Total GAD 7 Score 0 0  Anxiety Difficulty Not difficult at all        01/02/2022    3:57 PM 05/03/2019   10:29 AM  Depression screen PHQ 2/9  Decreased Interest 0 0  Down, Depressed, Hopeless 0 0  PHQ - 2 Score 0 0  Altered sleeping 0 0  Tired, decreased energy 0 0  Change in appetite 0 0  Feeling bad or failure about yourself  0 0  Trouble concentrating 0 0  Moving  slowly or fidgety/restless 0 0  Suicidal thoughts 0 0  PHQ-9 Score 0 0  Difficult doing work/chores Not difficult at all     BP Readings from Last 3 Encounters:  01/02/22 (!) 178/120  05/03/19 (!) 140/100  06/16/18 (!) 156/104    Physical Exam Vitals and nursing note reviewed.  HENT:     Head: Normocephalic.     Right Ear: Tympanic membrane and external ear normal. There is no impacted cerumen.     Left Ear: Tympanic membrane and external ear normal. There is no impacted cerumen.     Nose: Nose normal. No congestion or rhinorrhea.     Mouth/Throat:     Mouth: Mucous membranes are moist.  Eyes:      General: No scleral icterus.       Right eye: No discharge.        Left eye: No discharge.     Conjunctiva/sclera: Conjunctivae normal.     Pupils: Pupils are equal, round, and reactive to light.  Neck:     Thyroid: No thyromegaly.     Vascular: No JVD.     Trachea: No tracheal deviation.  Cardiovascular:     Rate and Rhythm: Normal rate and regular rhythm.     Heart sounds: Normal heart sounds. No murmur heard.    No friction rub. No gallop.  Pulmonary:     Effort: No respiratory distress.     Breath sounds: Normal breath sounds. No wheezing or rales.  Abdominal:     General: Bowel sounds are normal.     Palpations: Abdomen is soft. There is no mass.     Tenderness: There is no abdominal tenderness. There is no guarding or rebound.  Musculoskeletal:        General: No tenderness. Normal range of motion.     Cervical back: Normal range of motion and neck supple.  Lymphadenopathy:     Cervical: No cervical adenopathy.  Skin:    General: Skin is warm.     Findings: No rash.  Neurological:     Mental Status: He is alert.     Wt Readings from Last 3 Encounters:  01/02/22 240 lb (108.9 kg)  05/03/19 241 lb (109.3 kg)    BP (!) 178/120 (BP Location: Left Arm, Cuff Size: Large)   Ht _0  (1.778 m)   Wt 240 lb (108.9 kg)   BMI 34.44 kg/m   Assessment and Plan: 1. Primary hypertension Chronic.  Uncontrolled.  Stable.  Asymptomatic.  Patient has had elevated blood pressure readings in the past but has been hesitant to return for follow-up.  Today we will initiate amlodipine 5 mg and we will continue 5 mg once a day thereafter.  Patient will return in 1 week for blood pressure readings with readings that he is receiving at home. - Renal Function Panel - amLODipine (NORVASC) 5 MG tablet; Take 1 tablet (5 mg total) by mouth daily.  Dispense: 30 tablet; Refill: 0     Otilio Miu, MD

## 2022-01-03 LAB — RENAL FUNCTION PANEL
Albumin: 4.9 g/dL (ref 4.1–5.1)
BUN/Creatinine Ratio: 8 — ABNORMAL LOW (ref 9–20)
BUN: 9 mg/dL (ref 6–24)
CO2: 25 mmol/L (ref 20–29)
Calcium: 9.4 mg/dL (ref 8.7–10.2)
Chloride: 99 mmol/L (ref 96–106)
Creatinine, Ser: 1.06 mg/dL (ref 0.76–1.27)
Glucose: 92 mg/dL (ref 70–99)
Phosphorus: 4 mg/dL (ref 2.8–4.1)
Potassium: 4.5 mmol/L (ref 3.5–5.2)
Sodium: 139 mmol/L (ref 134–144)
eGFR: 89 mL/min/{1.73_m2} (ref >59)

## 2022-01-06 NOTE — Progress Notes (Signed)
Called pt left VM to call back.  KP 

## 2022-01-07 NOTE — Progress Notes (Signed)
3 rd attempt. Labs printed and mailed.  KP

## 2022-01-12 ENCOUNTER — Ambulatory Visit (INDEPENDENT_AMBULATORY_CARE_PROVIDER_SITE_OTHER): Payer: BC Managed Care – PPO | Admitting: Family Medicine

## 2022-01-12 ENCOUNTER — Encounter: Payer: Self-pay | Admitting: Family Medicine

## 2022-01-12 VITALS — BP 140/104 | HR 88 | Ht 70.0 in | Wt 242.0 lb

## 2022-01-12 DIAGNOSIS — I10 Essential (primary) hypertension: Secondary | ICD-10-CM | POA: Diagnosis not present

## 2022-01-12 MED ORDER — AMLODIPINE BESYLATE 5 MG PO TABS: 5.0000 mg | ORAL_TABLET | Freq: Every day | ORAL | 0 refills | Status: DC

## 2022-01-12 MED ORDER — VALSARTAN 80 MG PO TABS: 80.0000 mg | ORAL_TABLET | Freq: Every day | ORAL | 0 refills | Status: DC

## 2022-01-12 NOTE — Progress Notes (Signed)
Date:  01/12/2022   Name:  Duane Thompson   DOB:  1975-08-14   MRN:  [id number]   Chief Complaint: Hypertension  Hypertension This is a chronic problem. The current episode started more than 1 year ago. The problem has been gradually improving since onset. The problem is uncontrolled. Pertinent negatives include no chest pain, palpitations or shortness of breath. There are no associated agents to hypertension.    Lab Results  Component Value Date   NA 139 01/02/2022   K 4.5 01/02/2022   CO2 25 01/02/2022   GLUCOSE 92 01/02/2022   BUN 9 01/02/2022   CREATININE 1.06 01/02/2022   CALCIUM 9.4 01/02/2022   EGFR 89 01/02/2022   No results found for: "CHOL", "HDL", "LDLCALC", "LDLDIRECT", "TRIG", "CHOLHDL" No results found for: "TSH" No results found for: "HGBA1C" No results found for: "WBC", "HGB", "HCT", "MCV", "PLT" No results found for: "ALT", "AST", "GGT", "ALKPHOS", "BILITOT" No results found for: "25OHVITD2", "25OHVITD3", "VD25OH"   Review of Systems  HENT:  Negative for congestion.   Respiratory:  Negative for chest tightness, shortness of breath and wheezing.   Cardiovascular:  Negative for chest pain and palpitations.    There are no problems to display for this patient.   Allergies  Allergen Reactions   Amoxicillin Hives    Did it involve swelling of the face/tongue/throat, SOB, or low BP? No Did it involve sudden or severe rash/hives, skin peeling, or any reaction on the inside of your mouth or nose? No Did you need to seek medical attention at a hospital or doctor's office? No When did it last happen?    unknown   If all above answers are "NO", may proceed with cephalosporin use.     Past Surgical History:  Procedure Laterality Date   I & D EXTREMITY Right 06/16/2018   Procedure: IRRIGATION AND DEBRIDEMENT AND REVISION OF PARTIAL  AMPUTATION  RIGHT INDEX FINGER;  Surgeon: Leanora Cover, MD;  Location: [facility];  Service: Orthopedics;  Laterality:  Right;    Social History   Tobacco Use   Smoking status: Never   Smokeless tobacco: Former    Quit date: 02/17/2003  Substance Use Topics   Alcohol use: Yes    Comment: occas.   Drug use: Never     Medication list has been reviewed and updated.  Current Meds  Medication Sig   amLODipine (NORVASC) 5 MG tablet Take 1 tablet (5 mg total) by mouth daily.       01/12/2022   11:25 AM 01/02/2022    3:57 PM 05/03/2019   10:29 AM  GAD 7 : Generalized Anxiety Score  Nervous, Anxious, on Edge 0 0 0  Control/stop worrying 0 0 0  Worry too much - different things 0 0 0  Trouble relaxing 0 0 0  Restless 0 0 0  Easily annoyed or irritable 0 0 0  Afraid - awful might happen 0 0 0  Total GAD 7 Score 0 0 0  Anxiety Difficulty Not difficult at all Not difficult at all        01/12/2022   11:25 AM 01/02/2022    3:57 PM 05/03/2019   10:29 AM  Depression screen PHQ 2/9  Decreased Interest 0 0 0  Down, Depressed, Hopeless 0 0 0  PHQ - 2 Score 0 0 0  Altered sleeping 0 0 0  Tired, decreased energy 0 0 0  Change in appetite 0 0 0  Feeling bad or failure about  yourself  0 0 0  Trouble concentrating 0 0 0  Moving slowly or fidgety/restless 0 0 0  Suicidal thoughts 0 0 0  PHQ-9 Score 0 0 0  Difficult doing work/chores Not difficult at all Not difficult at all     BP Readings from Last 3 Encounters:  01/12/22 (!) 140/104  01/02/22 (!) 178/120  05/03/19 (!) 140/100    Physical Exam Vitals and nursing note reviewed.  HENT:     Head: Normocephalic.  Cardiovascular:     Heart sounds: No murmur heard.    No friction rub. No gallop.  Pulmonary:     Breath sounds: No wheezing, rhonchi or rales.  Abdominal:     Tenderness: There is no abdominal tenderness.     Hernia: No hernia is present.  Musculoskeletal:     Cervical back: Normal range of motion.  Neurological:     Mental Status: He is alert.     Motor: No weakness.     Coordination: Coordination normal.     Wt  Readings from Last 3 Encounters:  01/12/22 242 lb (109.8 kg)  01/02/22 240 lb (108.9 kg)  05/03/19 241 lb (109.3 kg)    BP (!) 140/104 (BP Location: Right Arm, Cuff Size: Large)   Pulse 88   Ht _0  (1.778 m)   Wt 242 lb (109.8 kg)   SpO2 98%   BMI 34.72 kg/m   Assessment and Plan:  1. Primary hypertension Chronic.  New onset with elevation.  Seen last week and initiated amlodipine 5 mg once a day.  And blood pressure is still elevated but improved to 140/104.  We will add valsartan 80 mg once a day to the regimen and recheck in 4 weeks in the meantime patient's been instructed to decrease sodium intake. - valsartan (DIOVAN) 80 MG tablet; Take 1 tablet (80 mg total) by mouth daily.  Dispense: 90 tablet; Refill: 0 - amLODipine (NORVASC) 5 MG tablet; Take 1 tablet (5 mg total) by mouth daily.  Dispense: 30 tablet; Refill: 0    Otilio Miu, MD

## 2022-01-27 ENCOUNTER — Other Ambulatory Visit: Payer: Self-pay | Admitting: Family Medicine

## 2022-01-27 DIAGNOSIS — I10 Essential (primary) hypertension: Secondary | ICD-10-CM

## 2022-01-27 MED ORDER — AMLODIPINE BESYLATE 5 MG PO TABS: 5.0000 mg | ORAL_TABLET | Freq: Every day | ORAL | 1 refills | Status: DC

## 2022-01-27 NOTE — Telephone Encounter (Signed)
Medication Refill - Medication:  amLODipine (NORVASC) 5 MG tablet   Has the patient contacted their pharmacy? No.  Preferred Pharmacy (with phone number or street name):  [facility] [phone] Dan Humphreys, [place] - [address] Phone: [phone]  Fax: [phone]     Has the patient been seen for an appointment in the last year OR does the patient have an upcoming appointment? Yes.    Agent: Please be advised that RX refills may take up to 3 business days. We ask that you follow-up with your pharmacy.

## 2022-01-27 NOTE — Telephone Encounter (Signed)
Requested Prescriptions  Pending Prescriptions Disp Refills   amLODipine (NORVASC) 5 MG tablet 90 tablet 1    Sig: Take 1 tablet (5 mg total) by mouth daily.     Cardiovascular: Calcium Channel Blockers 2 Failed - 01/27/2022 11:48 AM      Failed - Last BP in normal range    BP Readings from Last 1 Encounters:  01/12/22 (!) 140/104         Passed - Last Heart Rate in normal range    Pulse Readings from Last 1 Encounters:  01/12/22 88         Passed - Valid encounter within last 6 months    Recent Outpatient Visits           2 weeks ago Primary hypertension   [place] [facility], [facility]   3 weeks ago Primary hypertension   [place] [facility], [facility]   2 years ago Encounter to establish care   [facility] at [facility], MD       Future Appointments             In 2 weeks [name], MD [facility] at [facility], [facility]

## 2022-02-10 ENCOUNTER — Ambulatory Visit (INDEPENDENT_AMBULATORY_CARE_PROVIDER_SITE_OTHER): Payer: BC Managed Care – PPO | Admitting: Family Medicine

## 2022-02-10 ENCOUNTER — Encounter: Payer: Self-pay | Admitting: Family Medicine

## 2022-02-10 VITALS — BP 140/98 | HR 92 | Ht 70.0 in | Wt 247.0 lb

## 2022-02-10 DIAGNOSIS — I10 Essential (primary) hypertension: Secondary | ICD-10-CM | POA: Diagnosis not present

## 2022-02-10 NOTE — Progress Notes (Signed)
Date:  02/10/2022   Name:  Duane Thompson   DOB:  1975/08/01   MRN:  [id number]   Chief Complaint: Hypertension (Added valsartan to amlodipine)  Hypertension This is a chronic problem. The current episode started more than 1 month ago. The problem has been gradually improving since onset. The problem is uncontrolled. Pertinent negatives include no chest pain, headaches, neck pain, orthopnea, palpitations, peripheral edema, PND or shortness of breath. There are no associated agents to hypertension. Past treatments include calcium channel blockers and angiotensin blockers. The current treatment provides moderate improvement. There are no compliance problems.  There is no history of angina, kidney disease, CAD/MI, CVA, heart failure, left ventricular hypertrophy, PVD or retinopathy. There is no history of chronic renal disease, a hypertension causing med or renovascular disease.    Lab Results  Component Value Date   NA 139 01/02/2022   K 4.5 01/02/2022   CO2 25 01/02/2022   GLUCOSE 92 01/02/2022   BUN 9 01/02/2022   CREATININE 1.06 01/02/2022   CALCIUM 9.4 01/02/2022   EGFR 89 01/02/2022   No results found for: "CHOL", "HDL", "LDLCALC", "LDLDIRECT", "TRIG", "CHOLHDL" No results found for: "TSH" No results found for: "HGBA1C" No results found for: "WBC", "HGB", "HCT", "MCV", "PLT" No results found for: "ALT", "AST", "GGT", "ALKPHOS", "BILITOT" No results found for: "25OHVITD2", "25OHVITD3", "VD25OH"   Review of Systems  Constitutional:  Negative for chills and fever.  HENT:  Negative for drooling, ear discharge, ear pain and sore throat.   Respiratory:  Negative for cough, shortness of breath and wheezing.   Cardiovascular:  Negative for chest pain, palpitations, orthopnea, leg swelling and PND.  Gastrointestinal:  Negative for abdominal pain, blood in stool, constipation, diarrhea and nausea.  Endocrine: Negative for polydipsia.  Genitourinary:  Negative for dysuria,  frequency, hematuria and urgency.  Musculoskeletal:  Negative for back pain, myalgias and neck pain.  Skin:  Negative for rash.  Allergic/Immunologic: Negative for environmental allergies.  Neurological:  Negative for dizziness and headaches.  Hematological:  Does not bruise/bleed easily.  Psychiatric/Behavioral:  Negative for suicidal ideas. The patient is not nervous/anxious.     There are no problems to display for this patient.   Allergies  Allergen Reactions   Amoxicillin Hives    Did it involve swelling of the face/tongue/throat, SOB, or low BP? No Did it involve sudden or severe rash/hives, skin peeling, or any reaction on the inside of your mouth or nose? No Did you need to seek medical attention at a hospital or doctor's office? No When did it last happen?    unknown   If all above answers are "NO", may proceed with cephalosporin use.     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   Drug use: Never     Medication list has been reviewed and updated.  Current Meds  Medication Sig   amLODipine (NORVASC) 5 MG tablet Take 1 tablet (5 mg total) by mouth daily.   valsartan (DIOVAN) 80 MG tablet Take 1 tablet (80 mg  total) by mouth daily.       02/10/2022    4:17 PM 01/12/2022   11:25 AM 01/02/2022    3:57 PM 05/03/2019   10:29 AM  GAD 7 : Generalized Anxiety Score  Nervous, Anxious, on Edge 0 0 0 0  Control/stop worrying 0 0 0 0  Worry too much - different things 0 0 0 0  Trouble relaxing 0 0 0 0  Restless 0 0 0 0  Easily annoyed or irritable 0 0 0 0  Afraid - awful  might happen 0 0 0 0  Total GAD 7 Score 0 0 0 0  Anxiety Difficulty Not difficult at all Not difficult at all Not difficult at all        02/10/2022    4:16 PM 01/12/2022   11:25 AM 01/02/2022    3:57 PM  Depression screen PHQ 2/9  Decreased Interest 0 0 0  Down, Depressed, Hopeless 0 0 0  PHQ - 2 Score 0 0 0  Altered sleeping 0 0 0  Tired, decreased energy 0 0 0  Change in appetite 0 0 0  Feeling bad or failure about yourself  0 0 0  Trouble concentrating 0 0 0  Moving slowly or fidgety/restless 0 0 0  Suicidal thoughts 0 0 0  PHQ-9 Score 0 0 0  Difficult doing work/chores Not difficult at all Not difficult at all Not difficult at all    BP Readings from Last 3 Encounters:  02/10/22 (!) 140/98  01/12/22 (!) 140/104  01/02/22 (!) 178/120    Physical Exam Vitals and nursing note reviewed.  HENT:     Head: Normocephalic.     Right Ear: Tympanic membrane and external ear normal.     Left Ear: Tympanic membrane and external ear normal.     Nose: No congestion or rhinorrhea.  Neck:     Thyroid: No thyromegaly.     Vascular: No JVD.     Trachea: No tracheal deviation.  Cardiovascular:     Rate and Rhythm: Normal rate and regular rhythm.     Heart sounds: Normal heart sounds. No murmur heard.    No friction rub. No gallop.  Pulmonary:     Effort: No respiratory distress.     Breath sounds: Normal breath sounds. No wheezing, rhonchi or rales.  Abdominal:     General: Bowel sounds are normal.     Palpations: Abdomen is soft. There is no mass.     Tenderness: There is no abdominal tenderness. There is no guarding or rebound.  Musculoskeletal:        General: No tenderness. Normal range of motion.     Cervical back: Neck supple.  Lymphadenopathy:     Cervical: No cervical adenopathy.  Skin:    General: Skin is warm.     Findings: No bruising, erythema or rash.  Neurological:     Mental Status: He is alert.     Wt Readings from Last 3 Encounters:  02/10/22 247  lb (112 kg)  01/12/22 242 lb (109.8 kg)  01/02/22 240 lb (108.9 kg)    BP (!) 140/98 (BP Location: Right Arm, Cuff Size: Large)   Pulse 92   Ht _0  (1.778 m)   Wt 247 lb (112 kg)   SpO2 98%   BMI 35.44 kg/m   Assessment and Plan:  1. Primary hypertension New onset.  Persistent.  Stable.  But improving.  Blood pressure today 140/98.  Will continue amlodipine at  5 mg once a day and increase valsartan to 160 mg once a day.  Will recheck blood pressure in 4 weeks.   Otilio Miu, MD

## 2022-03-13 ENCOUNTER — Encounter: Payer: Self-pay | Admitting: Family Medicine

## 2022-03-13 ENCOUNTER — Ambulatory Visit (INDEPENDENT_AMBULATORY_CARE_PROVIDER_SITE_OTHER): Payer: BC Managed Care – PPO | Admitting: Family Medicine

## 2022-03-13 VITALS — BP 122/76 | HR 92 | Ht 70.0 in | Wt 247.0 lb

## 2022-03-13 DIAGNOSIS — I10 Essential (primary) hypertension: Secondary | ICD-10-CM | POA: Diagnosis not present

## 2022-03-13 MED ORDER — AMLODIPINE BESYLATE 5 MG PO TABS: 5.0000 mg | ORAL_TABLET | Freq: Every day | ORAL | 1 refills | Status: DC

## 2022-03-13 MED ORDER — VALSARTAN 160 MG PO TABS: 160.0000 mg | ORAL_TABLET | Freq: Every day | ORAL | 1 refills | Status: DC

## 2022-03-13 NOTE — Progress Notes (Signed)
Date:  03/13/2022   Name:  Duane Thompson   DOB:  Jun 20, 1975   MRN:  [id number]   Chief Complaint: Hypertension  Hypertension This is a chronic problem. The current episode started more than 1 year ago. The problem has been gradually improving since onset. The problem is controlled. Pertinent negatives include no anxiety, blurred vision, chest pain, headaches, malaise/fatigue, neck pain, orthopnea, palpitations, peripheral edema, PND, shortness of breath or sweats. There are no associated agents to hypertension. Risk factors for coronary artery disease include dyslipidemia. Past treatments include angiotensin blockers and calcium channel blockers. The current treatment provides moderate improvement. There are no compliance problems.  There is no history of angina, kidney disease, CAD/MI, CVA, heart failure, left ventricular hypertrophy, PVD or retinopathy. There is no history of chronic renal disease, a hypertension causing med or renovascular disease.    Lab Results  Component Value Date   NA 139 01/02/2022   K 4.5 01/02/2022   CO2 25 01/02/2022   GLUCOSE 92 01/02/2022   BUN 9 01/02/2022   CREATININE 1.06 01/02/2022   CALCIUM 9.4 01/02/2022   EGFR 89 01/02/2022   No results found for: "CHOL", "HDL", "LDLCALC", "LDLDIRECT", "TRIG", "CHOLHDL" No results found for: "TSH" No results found for: "HGBA1C" No results found for: "WBC", "HGB", "HCT", "MCV", "PLT" No results found for: "ALT", "AST", "GGT", "ALKPHOS", "BILITOT" No results found for: "25OHVITD2", "25OHVITD3", "VD25OH"   Review of Systems  Constitutional:  Negative for chills, fever and malaise/fatigue.  HENT:  Negative for drooling, ear discharge, ear pain and sore throat.   Eyes:  Negative for blurred vision.  Respiratory:  Negative for cough, shortness of breath and wheezing.   Cardiovascular:  Negative for chest pain, palpitations, orthopnea, leg swelling and PND.  Gastrointestinal:  Negative for abdominal pain,  blood in stool, constipation, diarrhea and nausea.  Endocrine: Negative for polydipsia.  Genitourinary:  Negative for dysuria, frequency, hematuria and urgency.  Musculoskeletal:  Negative for back pain, myalgias and neck pain.  Skin:  Negative for rash.  Allergic/Immunologic: Negative for environmental allergies.  Neurological:  Negative for dizziness and headaches.  Hematological:  Does not bruise/bleed easily.  Psychiatric/Behavioral:  Negative for suicidal ideas. The patient is not nervous/anxious.     There are no problems to display for this patient.   Allergies  Allergen Reactions   Amoxicillin Hives    Did it involve swelling of the face/tongue/throat, SOB, or low BP? No Did it involve sudden or severe rash/hives, skin peeling, or any reaction on the inside of your mouth or nose? No Did you need to seek medical attention at a hospital or doctor's office? No When did it last happen?    unknown   If all above answers are "NO", may proceed with cephalosporin use.     Past Surgical History:  Procedure Laterality Date   I & D EXTREMITY Right 06/16/2018   Procedure: IRRIGATION AND DEBRIDEMENT AND REVISION OF PARTIAL  AMPUTATION  RIGHT INDEX FINGER;  Surgeon: Betha Loa, MD;  Location[facility];  Service: Orthopedics;  Laterality: Right;    Social History   Tobacco Use   Smoking status: Never   Smokeless tobacco: Former    Quit date: 02/17/2003  Substance Use Topics   Alcohol use: Yes    Comment: occas.   Drug use: Never     Medication list has been reviewed and updated.  Current Meds  Medication Sig   amLODipine (NORVASC) 5 MG tablet Take 1 tablet (5  mg total) by mouth daily.   valsartan (DIOVAN) 80 MG tablet Take 1 tablet (80 mg total) by mouth daily.       03/13/2022    3:56 PM 02/10/2022    4:17 PM 01/12/2022   11:25 AM 01/02/2022    3:57 PM  GAD 7 : Generalized Anxiety Score  Nervous, Anxious, on Edge 0 0 0 0  Control/stop worrying 0 0 0 0  Worry too much  - different things 0 0 0 0  Trouble relaxing 0 0 0 0  Restless 0 0 0 0  Easily annoyed or irritable 0 0 0 0  Afraid - awful might happen 0 0 0 0  Total GAD 7 Score 0 0 0 0  Anxiety Difficulty Not difficult at all Not difficult at all Not difficult at all Not difficult at all       03/13/2022    3:56 PM 02/10/2022    4:16 PM 01/12/2022   11:25 AM  Depression screen PHQ 2/9  Decreased Interest 0 0 0  Down, Depressed, Hopeless 0 0 0  PHQ - 2 Score 0 0 0  Altered sleeping 0 0 0  Tired, decreased energy 0 0 0  Change in appetite 0 0 0  Feeling bad or failure about yourself  0 0 0  Trouble concentrating 0 0 0  Moving slowly or fidgety/restless 0 0 0  Suicidal thoughts 0 0 0  PHQ-9 Score 0 0 0  Difficult doing work/chores Not difficult at all Not difficult at all Not difficult at all    BP Readings from Last 3 Encounters:  03/13/22 122/76  02/10/22 (!) 140/98  01/12/22 (!) 140/104    Physical Exam Vitals and nursing note reviewed.  HENT:     Head: Normocephalic.     Right Ear: External ear normal.     Left Ear: External ear normal.     Nose: Nose normal.  Eyes:     General: No scleral icterus.       Right eye: No discharge.        Left eye: No discharge.     Conjunctiva/sclera: Conjunctivae normal.     Pupils: Pupils are equal, round, and reactive to light.  Neck:     Thyroid: No thyromegaly.     Vascular: No JVD.     Trachea: No tracheal deviation.  Cardiovascular:     Rate and Rhythm: Normal rate and regular rhythm.     Heart sounds: Normal heart sounds. No murmur heard.    No friction rub. No gallop.  Pulmonary:     Effort: No respiratory distress.     Breath sounds: Normal breath sounds. No wheezing or rales.  Abdominal:     General: Bowel sounds are normal.     Palpations: Abdomen is soft. There is no mass.     Tenderness: There is no abdominal tenderness. There is no guarding or rebound.  Musculoskeletal:        General: No tenderness. Normal range of  motion.     Cervical back: Normal range of motion and neck supple.  Lymphadenopathy:     Cervical: No cervical adenopathy.  Skin:    General: Skin is warm.     Findings: No rash.  Neurological:     Mental Status: He is alert and oriented to person, place, and time.     Cranial Nerves: No cranial nerve deficit.     Wt Readings from Last 3 Encounters:  03/13/22 247 lb (112  kg)  02/10/22 247 lb (112 kg)  01/12/22 242 lb (109.8 kg)    BP 122/76   Pulse 92   Ht 5\' 10"  (1.778 m)   Wt 247 lb (112 kg)   SpO2 97%   BMI 35.44 kg/m   Assessment and Plan:  1. Primary hypertension Chronic.  Controlled.  Stable.  Blood pressure 122/76.  Asymptomatic.  Tolerating medications well.  Continue amlodipine 5 mg once a day and valsartan 160 mg once a day.  Will recheck blood pressure in 6 months and will likely do labs at that time. - amLODipine (NORVASC) 5 MG tablet; Take 1 tablet (5 mg total) by mouth daily.  Dispense: 90 tablet; Refill: 1 - valsartan (DIOVAN) 160 MG tablet; Take 1 tablet (160 mg total) by mouth daily.  Dispense: 90 tablet; Refill: 1    Otilio Miu, MD

## 2022-06-10 ENCOUNTER — Other Ambulatory Visit: Payer: Self-pay | Admitting: Family Medicine

## 2022-06-10 DIAGNOSIS — I10 Essential (primary) hypertension: Secondary | ICD-10-CM

## 2022-06-10 NOTE — Telephone Encounter (Signed)
Unable to refill per protocol, Rx request is too soon. Last refill 03/13/22 for 90 and 1 refill.  Requested Prescriptions  Pending Prescriptions Disp Refills   valsartan (DIOVAN) 160 MG tablet [Pharmacy Med Name: VALSARTAN 160 MG TABLET] 90 tablet 1    Sig: TAKE 1 TABLET BY MOUTH EVERY DAY     Cardiovascular:  Angiotensin Receptor Blockers Passed - 06/10/2022 12:10 PM      Passed - Cr in normal range and within 180 days    Creatinine, Ser  Date Value Ref Range Status  01/02/2022 1.06 0.76 - 1.27 mg/dL Final         Passed - K in normal range and within 180 days    Potassium  Date Value Ref Range Status  01/02/2022 4.5 3.5 - 5.2 mmol/L Final         Passed - Patient is not pregnant      Passed - Last BP in normal range    BP Readings from Last 1 Encounters:  03/13/22 122/76         Passed - Valid encounter within last 6 months    Recent Outpatient Visits           2 months ago Primary hypertension   [place] [facility]   4 months ago Primary hypertension   [place] [facility], MD   4 months ago Primary hypertension   [place] [facility], MD   5 months ago Primary hypertension   [place] [facility], MD   3 years ago Encounter to establish care   [facility] at [facility], [facility]       Future Appointments             In 3 months [name], MD [facility] at [facility], [facility]

## 2022-09-09 ENCOUNTER — Other Ambulatory Visit: Payer: Self-pay | Admitting: Family Medicine

## 2022-09-09 DIAGNOSIS — I10 Essential (primary) hypertension: Secondary | ICD-10-CM

## 2022-09-10 NOTE — Telephone Encounter (Signed)
Requested Prescriptions  Pending Prescriptions Disp Refills   valsartan (DIOVAN) 160 MG tablet [Pharmacy Med Name: VALSARTAN 160 MG TABLET] 90 tablet 0    Sig: TAKE 1 TABLET BY MOUTH EVERY DAY     Cardiovascular:  Angiotensin Receptor Blockers Failed - 09/09/2022  8:18 AM      Failed - Cr in normal range and within 180 days    Creatinine, Ser  Date Value Ref Range Status  01/02/2022 1.06 0.76 - 1.27 mg/dL Final         Failed - K in normal range and within 180 days    Potassium  Date Value Ref Range Status  01/02/2022 4.5 3.5 - 5.2 mmol/L Final         Passed - Patient is not pregnant      Passed - Last BP in normal range    BP Readings from Last 1 Encounters:  03/13/22 122/76         Passed - Valid encounter within last 6 months    Recent Outpatient Visits           6 months ago Primary hypertension   [place] [facility]   7 months ago Primary hypertension   [place] [facility] MD   8 months ago Primary hypertension   [place] [facility] MD   8 months ago Primary hypertension    [facility] at [facility], [facility]   3 years ago Encounter to establish care   [facility] at [facility], [facility]       Future Appointments             Tomorrow [name], MD [facility] at [facility], [facility]

## 2022-09-11 ENCOUNTER — Encounter: Payer: Self-pay | Admitting: Family Medicine

## 2022-09-11 ENCOUNTER — Ambulatory Visit (INDEPENDENT_AMBULATORY_CARE_PROVIDER_SITE_OTHER): Payer: BC Managed Care – PPO | Admitting: Family Medicine

## 2022-09-11 ENCOUNTER — Telehealth: Payer: Self-pay | Admitting: Family Medicine

## 2022-09-11 ENCOUNTER — Ambulatory Visit: Payer: BC Managed Care – PPO | Admitting: Family Medicine

## 2022-09-11 VITALS — BP 132/92 | HR 76 | Ht 70.0 in | Wt 241.0 lb

## 2022-09-11 DIAGNOSIS — I10 Essential (primary) hypertension: Secondary | ICD-10-CM | POA: Diagnosis not present

## 2022-09-11 DIAGNOSIS — Z1211 Encounter for screening for malignant neoplasm of colon: Secondary | ICD-10-CM

## 2022-09-11 DIAGNOSIS — Z6834 Body mass index (BMI) 34.0-34.9, adult: Secondary | ICD-10-CM

## 2022-09-11 MED ORDER — AMLODIPINE BESYLATE 10 MG PO TABS: 10.0000 mg | ORAL_TABLET | Freq: Every day | ORAL | 1 refills | Status: DC

## 2022-09-11 MED ORDER — VALSARTAN 160 MG PO TABS: 160.0000 mg | ORAL_TABLET | Freq: Every day | ORAL | 1 refills | Status: DC

## 2022-09-11 NOTE — Progress Notes (Signed)
Date:  09/11/2022   Name:  Duane Thompson   DOB:  1975-11-27   MRN:  [id number]   Chief Complaint: Hypertension  Hypertension This is a chronic problem. The current episode started more than 1 year ago. The problem has been gradually improving since onset. The problem is controlled. Pertinent negatives include no blurred vision, chest pain, headaches, orthopnea, palpitations, peripheral edema, PND or shortness of breath. There are no associated agents to hypertension. There are no known risk factors for coronary artery disease. Past treatments include angiotensin blockers and calcium channel blockers. There are no compliance problems.  There is no history of angina, kidney disease, CAD/MI, CVA, heart failure, left ventricular hypertrophy, PVD or retinopathy. There is no history of chronic renal disease, a hypertension causing med or renovascular disease.    Lab Results  Component Value Date   NA 139 01/02/2022   K 4.5 01/02/2022   CO2 25 01/02/2022   GLUCOSE 92 01/02/2022   BUN 9 01/02/2022   CREATININE 1.06 01/02/2022   CALCIUM 9.4 01/02/2022   EGFR 89 01/02/2022   No results found for: "CHOL", "HDL", "LDLCALC", "LDLDIRECT", "TRIG", "CHOLHDL" No results found for: "TSH" No results found for: "HGBA1C" No results found for: "WBC", "HGB", "HCT", "MCV", "PLT" No results found for: "ALT", "AST", "GGT", "ALKPHOS", "BILITOT" No results found for: "25OHVITD2", "25OHVITD3", "VD25OH"   Review of Systems  Constitutional:  Negative for chills and fatigue.  HENT:  Negative for congestion, postnasal drip, sneezing, sore throat and trouble swallowing.   Eyes:  Negative for blurred vision and visual disturbance.  Respiratory:  Negative for chest tightness and shortness of breath.   Cardiovascular:  Negative for chest pain, palpitations, orthopnea and PND.  Gastrointestinal:  Negative for abdominal pain and blood in stool.  Neurological:  Negative for headaches.    There are no  problems to display for this patient.   Allergies  Allergen Reactions   Amoxicillin Hives    Did it involve swelling of the face/tongue/throat, SOB, or low BP? No Did it involve sudden or severe rash/hives, skin peeling, or any reaction on the inside of your mouth or nose? No Did you need to seek medical attention at a hospital or doctor's office? No When did it last happen?    unknown   If all above answers are "NO", may proceed with cephalosporin use.     Past Surgical History:  Procedure Laterality Date   I & D EXTREMITY Right 06/16/2018   Procedure: IRRIGATION AND DEBRIDEMENT AND REVISION OF PARTIAL  AMPUTATION  RIGHT INDEX FINGER;  Surgeon: Betha Loa, MD;  Location: [facility] OR;  Service: Orthopedics;  Laterality: Right;    Social History   Tobacco Use   Smoking status: Never   Smokeless tobacco: Former    Quit date: 02/17/2003  Substance Use Topics   Alcohol use: Yes    Comment: occas.   Drug use: Never     Medication list has been reviewed and updated.  Current Meds  Medication Sig   valsartan (DIOVAN) 160 MG tablet TAKE 1 TABLET BY MOUTH EVERY DAY   [DISCONTINUED] amLODipine (NORVASC) 5 MG tablet Take 1 tablet (5 mg total) by mouth daily.       09/11/2022    8:55 AM 03/13/2022    3:56 PM 02/10/2022    4:17 PM 01/12/2022   11:25 AM  GAD 7 : Generalized Anxiety Score  Nervous, Anxious, on Edge 0 0 0 0  Control/stop worrying 0 0 0  0  Worry too much - different things 0 0 0 0  Trouble relaxing 0 0 0 0  Restless 0 0 0 0  Easily annoyed or irritable 0 0 0 0  Afraid - awful might happen 0 0 0 0  Total GAD 7 Score 0 0 0 0  Anxiety Difficulty Not difficult at all Not difficult at all Not difficult at all Not difficult at all       09/11/2022    8:55 AM 03/13/2022    3:56 PM 02/10/2022    4:16 PM  Depression screen PHQ 2/9  Decreased Interest 0 0 0  Down, Depressed, Hopeless 0 0 0  PHQ - 2 Score 0 0 0  Altered sleeping 0 0 0  Tired, decreased energy 0 0 0   Change in appetite 0 0 0  Feeling bad or failure about yourself  0 0 0  Trouble concentrating 0 0 0  Moving slowly or fidgety/restless 0 0 0  Suicidal thoughts 0 0 0  PHQ-9 Score 0 0 0  Difficult doing work/chores Not difficult at all Not difficult at all Not difficult at all    BP Readings from Last 3 Encounters:  09/11/22 (!) 132/92  03/13/22 122/76  02/10/22 (!) 140/98    Physical Exam Vitals and nursing note reviewed.  HENT:     Head: Normocephalic.     Right Ear: External ear normal.     Left Ear: External ear normal.     Nose: Nose normal.     Mouth/Throat:     Mouth: Mucous membranes are moist.  Eyes:     General: No scleral icterus.       Right eye: No discharge.        Left eye: No discharge.     Conjunctiva/sclera: Conjunctivae normal.     Pupils: Pupils are equal, round, and reactive to light.  Neck:     Thyroid: No thyromegaly.     Vascular: No JVD.     Trachea: No tracheal deviation.  Cardiovascular:     Rate and Rhythm: Normal rate and regular rhythm.     Heart sounds: Normal heart sounds, S1 normal and S2 normal. No murmur heard.    No systolic murmur is present.     No diastolic murmur is present.     No friction rub. No gallop. No S3 or S4 sounds.  Pulmonary:     Effort: No respiratory distress.     Breath sounds: Normal breath sounds. No decreased air movement. No decreased breath sounds, wheezing, rhonchi or rales.  Abdominal:     General: Bowel sounds are normal.     Palpations: Abdomen is soft. There is no mass.     Tenderness: There is no abdominal tenderness. There is no guarding or rebound.  Musculoskeletal:        General: No tenderness. Normal range of motion.     Cervical back: Normal range of motion and neck supple.  Lymphadenopathy:     Cervical: No cervical adenopathy.  Skin:    General: Skin is warm.     Findings: No rash.  Neurological:     Mental Status: He is alert.     Deep Tendon Reflexes: Reflexes are normal and  symmetric.     Reflex Scores:      Patellar reflexes are 2+ on the right side and 2+ on the left side.    Wt Readings from Last 3 Encounters:  09/11/22 241 lb (109.3 kg)  03/13/22 247 lb (112 kg)  02/10/22 247 lb (112 kg)    BP (!) 132/92 (BP Location: Right Arm, Cuff Size: Large)   Pulse 76   Ht 5\' 10"  (1.778 m)   Wt 241 lb (109.3 kg)   SpO2 99%   BMI 34.58 kg/m   Assessment and Plan: 1. Primary hypertension Chronic.  Controlled.  Stable.  Blood pressure today 132/92.  Asymptomatic.  Tolerating medications well.  Will continue valsartan at 160 mg once a day and increase in amlodipine 10 mg once a day.  Will recheck in 6 weeks.  We will in the meantime check CMP for electrolytes and GFR. - valsartan (DIOVAN) 160 MG tablet; Take 1 tablet (160 mg total) by mouth daily.  Dispense: 90 tablet; Refill: 1 - Comprehensive Metabolic Panel (CMET) - amLODipine (NORVASC) 10 MG tablet; Take 1 tablet (10 mg total) by mouth daily.  Dispense: 90 tablet; Refill: 1  2. BMI 34.0-34.9,adult Elevated BMI will check lipid panel for lipid management. - Lipid Panel With LDL/HDL Ratio  3. Colon cancer screening Discussed and referral placed to GI for colon cancer screening. - Ambulatory referral to Gastroenterology     Elizabeth Sauer, MD

## 2022-09-11 NOTE — Telephone Encounter (Signed)
routed

## 2022-09-14 ENCOUNTER — Telehealth: Payer: Self-pay

## 2022-09-14 ENCOUNTER — Other Ambulatory Visit: Payer: Self-pay

## 2022-09-14 DIAGNOSIS — Z1211 Encounter for screening for malignant neoplasm of colon: Secondary | ICD-10-CM

## 2022-09-14 MED ORDER — NA SULFATE-K SULFATE-MG SULF 17.5-3.13-1.6 GM/177ML PO SOLN: 1.0000 | Freq: Once | ORAL | 0 refills | Status: AC

## 2022-09-14 NOTE — Telephone Encounter (Signed)
Gastroenterology Pre-Procedure Review  Request Date: 10/27/22 Requesting Physician: Dr. Servando Snare  PATIENT REVIEW QUESTIONS: The patient responded to the following health history questions as indicated:    1. Are you having any GI issues? no 2. Do you have a personal history of Polyps? no 3. Do you have a family history of Colon Cancer or Polyps? yes (grandfather may have had colon cancer) 4. Diabetes Mellitus? no 5. Joint replacements in the past 12 months?no 6. Major health problems in the past 3 months?no 7. Any artificial heart valves, MVP, or defibrillator?no    MEDICATIONS & ALLERGIES:    Patient reports the following regarding taking any anticoagulation/antiplatelet therapy:   Plavix, Coumadin, Eliquis, Xarelto, Lovenox, Pradaxa, Brilinta, or Effient? no Aspirin? no  Patient confirms/reports the following medications:  Current Outpatient Medications  Medication Sig Dispense Refill   amLODipine (NORVASC) 10 MG tablet Take 1 tablet (10 mg total) by mouth daily. 90 tablet 1   valsartan (DIOVAN) 160 MG tablet Take 1 tablet (160 mg total) by mouth daily. 90 tablet 1   No current facility-administered medications for this visit.    Patient confirms/reports the following allergies:  Allergies  Allergen Reactions   Amoxicillin Hives    Did it involve swelling of the face/tongue/throat, SOB, or low BP? No Did it involve sudden or severe rash/hives, skin peeling, or any reaction on the inside of your mouth or nose? No Did you need to seek medical attention at a hospital or doctor's office? No When did it last happen?    unknown   If all above answers are "NO", may proceed with cephalosporin use.     No orders of the defined types were placed in this encounter.   AUTHORIZATION INFORMATION Primary Insurance: 1D#: Group #:  Secondary Insurance: 1D#: Group #:  SCHEDULE INFORMATION: Date: 10/27/22 Time: Location: [facility]

## 2022-09-22 ENCOUNTER — Telehealth: Payer: Self-pay

## 2022-09-22 NOTE — Telephone Encounter (Signed)
Pt left message has questiion inref to procedure please return call

## 2022-09-28 ENCOUNTER — Telehealth: Payer: Self-pay

## 2022-09-28 NOTE — Telephone Encounter (Signed)
Patients wife Ferdinand Cava contacted office to request billing codes for patients colonoscopy.  Returned phone call to patient left voice message providing ICD Code Z12.11 and CPT [phone].  Thanks,  [place], [place]

## 2022-10-26 ENCOUNTER — Encounter: Payer: Self-pay | Admitting: Gastroenterology

## 2022-10-27 ENCOUNTER — Other Ambulatory Visit: Payer: Self-pay

## 2022-10-27 ENCOUNTER — Ambulatory Visit
Admission: RE | Admit: 2022-10-27 | Discharge: 2022-10-27 | Disposition: A | Discharge status: Home / Self Care | Payer: BC Managed Care – PPO | Attending: Gastroenterology | Admitting: Gastroenterology

## 2022-10-27 ENCOUNTER — Ambulatory Visit: Payer: BC Managed Care – PPO | Admitting: Certified Registered"

## 2022-10-27 ENCOUNTER — Encounter: Payer: Self-pay | Admitting: Gastroenterology

## 2022-10-27 ENCOUNTER — Encounter
Admission: RE | Disposition: A | Discharge status: Home / Self Care | Payer: Self-pay | Source: Home / Self Care | Attending: Gastroenterology

## 2022-10-27 DIAGNOSIS — D124 Benign neoplasm of descending colon: Secondary | ICD-10-CM | POA: Diagnosis not present

## 2022-10-27 DIAGNOSIS — D125 Benign neoplasm of sigmoid colon: Secondary | ICD-10-CM | POA: Insufficient documentation

## 2022-10-27 DIAGNOSIS — K640 First degree hemorrhoids: Secondary | ICD-10-CM | POA: Insufficient documentation

## 2022-10-27 DIAGNOSIS — K635 Polyp of colon: Secondary | ICD-10-CM | POA: Diagnosis not present

## 2022-10-27 DIAGNOSIS — I10 Essential (primary) hypertension: Secondary | ICD-10-CM | POA: Insufficient documentation

## 2022-10-27 DIAGNOSIS — Z1211 Encounter for screening for malignant neoplasm of colon: Secondary | ICD-10-CM

## 2022-10-27 HISTORY — DX: Essential (primary) hypertension: I10

## 2022-10-27 HISTORY — PX: COLONOSCOPY WITH PROPOFOL: SHX5780

## 2022-10-27 HISTORY — PX: POLYPECTOMY: SHX5525

## 2022-10-27 SURGERY — COLONOSCOPY WITH PROPOFOL
Anesthesia: General

## 2022-10-27 MED ORDER — LIDOCAINE HCL (CARDIAC) PF 100 MG/5ML IV SOSY
PREFILLED_SYRINGE | INTRAVENOUS | Status: DC | PRN
  Administered 2022-10-27 (×2): 100 mg via INTRAVENOUS

## 2022-10-27 MED ORDER — PROPOFOL 10 MG/ML IV BOLUS
INTRAVENOUS | Status: DC | PRN
  Administered 2022-10-27: 150 ug/kg/min via INTRAVENOUS
  Administered 2022-10-27 (×2): 50 mg via INTRAVENOUS

## 2022-10-27 MED ORDER — DEXMEDETOMIDINE HCL IN NACL 200 MCG/50ML IV SOLN
INTRAVENOUS | Status: DC | PRN
  Administered 2022-10-27: 8 ug via INTRAVENOUS

## 2022-10-27 MED ORDER — SODIUM CHLORIDE 0.9 % IV SOLN: INTRAVENOUS | Status: DC

## 2022-10-27 NOTE — Anesthesia Preprocedure Evaluation (Signed)
Anesthesia Evaluation  Patient identified by MRN, date of birth, ID band Patient awake    Reviewed: Allergy & Precautions, H&P , NPO status , Patient's Chart, lab work & pertinent test results, reviewed documented beta blocker date and time   History of Anesthesia Complications Negative for: history of anesthetic complications  Airway Mallampati: III  TM Distance: >3 FB Neck ROM: full    Dental  (+) Dental Advidsory Given   Pulmonary neg pulmonary ROS, Continuous Positive Airway Pressure Ventilation    Pulmonary exam normal breath sounds clear to auscultation       Cardiovascular Exercise Tolerance: Good hypertension, (-) angina (-) Past MI and (-) Cardiac Stents Normal cardiovascular exam(-) dysrhythmias (-) Valvular Problems/Murmurs Rhythm:regular Rate:Normal     Neuro/Psych negative neurological ROS  negative psych ROS   GI/Hepatic negative GI ROS, Neg liver ROS,,,  Endo/Other  negative endocrine ROS    Renal/GU negative Renal ROS  negative genitourinary   Musculoskeletal   Abdominal   Peds  Hematology negative hematology ROS (+)   Anesthesia Other Findings Past Medical History: No date: Hypertension   Reproductive/Obstetrics negative OB ROS                             Anesthesia Physical Anesthesia Plan  ASA: 2  Anesthesia Plan: General   Post-op Pain Management:    Induction: Intravenous  PONV Risk Score and Plan: 2 and Propofol infusion and TIVA  Airway Management Planned: Natural Airway and Nasal Cannula  Additional Equipment:   Intra-op Plan:   Post-operative Plan:   Informed Consent: I have reviewed the patients History and Physical, chart, labs and discussed the procedure including the risks, benefits and alternatives for the proposed anesthesia with the patient or authorized representative who has indicated his/her understanding and acceptance.     Dental  Advisory Given  Plan Discussed with: Anesthesiologist, CRNA and Surgeon  Anesthesia Plan Comments:        Anesthesia Quick Evaluation

## 2022-10-27 NOTE — H&P (Signed)
   Duane Minium, MD [facility] [address], [address] [place], [place] [address] Phone: [phone] Fax : [phone]  Primary Care Physician:  [name], MD Primary Gastroenterologist:  Dr. Servando Snare  Pre-Procedure History & Physical: HPI:  Duane Thompson is a 47 y.o. male is here for a screening colonoscopy.   Past Medical History:  Diagnosis Date   Hypertension     Past Surgical History:  Procedure Laterality Date   APPENDECTOMY     I & D EXTREMITY Right 06/16/2018   Procedure: IRRIGATION AND DEBRIDEMENT AND REVISION OF PARTIAL  AMPUTATION  RIGHT INDEX FINGER;  Surgeon: Betha Loa, MD;  Location: [facility] OR;  Service: Orthopedics;  Laterality: Right;    Prior to Admission medications   Medication Sig Start Date End Date Taking? Authorizing Provider  amLODipine (NORVASC) 10 MG tablet Take 1 tablet (10 mg total) by mouth daily. 09/11/22  Yes [name], MD  valsartan (DIOVAN) 160 MG tablet Take 1 tablet (160 mg total) by mouth daily. 09/11/22  Yes [name], MD    Allergies as of 09/14/2022 - Review Complete 09/14/2022  Allergen Reaction Noted   Amoxicillin Hives 06/16/2018    History reviewed. No pertinent family history.  Social History   Socioeconomic History   Marital status: Married    Spouse name: Not on file   Number of children: Not on file   Years of education: Not on file   Highest education level: Not on file  Occupational History   Not on file  Tobacco Use   Smoking status: Never   Smokeless tobacco: Former    Quit date: 02/17/2003  Vaping Use   Vaping status: Never Used  Substance and Sexual Activity   Alcohol use: Yes    Comment: occas.   Drug use: Never   Sexual activity: Yes  Other Topics Concern   Not on file  Social History Narrative   Not on file   Social Determinants of Health   Financial Resource Strain: Not on file  Food Insecurity: Not on file  Transportation Needs: Not on file  Physical Activity: Not on file   Stress: Not on file  Social Connections: Not on file  Intimate Partner Violence: Not on file    Review of Systems: See HPI, otherwise negative ROS  Physical Exam: BP 105/67   Pulse 79   Temp (!) 96.3 F (35.7 C) (Temporal)   Resp 16   Ht 5\' 10"  (1.778 m)   Wt 105.6 kg   SpO2 98%   BMI 33.40 kg/m  General:   Alert,  pleasant and cooperative in NAD Head:  Normocephalic and atraumatic. Neck:  Supple; no masses or thyromegaly. Lungs:  Clear throughout to auscultation.    Heart:  Regular rate and rhythm. Abdomen:  Soft, nontender and nondistended. Normal bowel sounds, without guarding, and without rebound.   Neurologic:  Alert and  oriented x4;  grossly normal neurologically.  Impression/Plan: Duane Thompson is now here to undergo a screening colonoscopy.  Risks, benefits, and alternatives regarding colonoscopy have been reviewed with the patient.  Questions have been answered.  All parties agreeable.

## 2022-10-27 NOTE — Op Note (Signed)
[facility] Gastroenterology Patient Name: Duane Thompson Procedure Date: 10/27/2022 8:13 AM MRN: [id number] Account #: [id number] Date of Birth: December 23, 1975 Admit Type: Outpatient Age: 47 Room: [facility] ENDO ROOM 4 Gender: Male Note Status: Finalized Instrument Name: Nelda Marseille [id number] Procedure:             Colonoscopy Indications:           Screening for colorectal malignant neoplasm Providers:             Midge Minium MD, MD Referring MD:          [name], MD (Referring MD) Medicines:             Propofol per Anesthesia Complications:         No immediate complications. Procedure:             Pre-Anesthesia Assessment:                        - Prior to the procedure, a History and Physical was                         performed, and patient medications and allergies were                         reviewed. The patient's tolerance of previous                         anesthesia was also reviewed. The risks and benefits                         of the procedure and the sedation options and risks                         were discussed with the patient. All questions were                         answered, and informed consent was obtained. Prior                         Anticoagulants: The patient has taken no anticoagulant                         or antiplatelet agents. ASA Grade Assessment: II - A                         patient with mild systemic disease. After reviewing                         the risks and benefits, the patient was deemed in                         satisfactory condition to undergo the procedure.                        After obtaining informed consent, the colonoscope was                         passed under direct vision. Throughout the procedure,  the patient's blood pressure, pulse, and oxygen                         saturations were monitored continuously. The                         Colonoscope was introduced through  the anus and                         advanced to the the cecum, identified by appendiceal                         orifice and ileocecal valve. The colonoscopy was                         performed without difficulty. The patient tolerated                         the procedure well. The quality of the bowel                         preparation was excellent. Findings:      The perianal and digital rectal examinations were normal.      A 7 mm polyp was found in the sigmoid colon. The polyp was pedunculated.       The polyp was removed with a cold snare. Resection and retrieval were       complete.      Two sessile polyps were found in the descending colon. The polyps were 3       to 6 mm in size. These polyps were removed with a cold snare. Resection       and retrieval were complete.      Non-bleeding internal hemorrhoids were found during retroflexion. The       hemorrhoids were Grade I (internal hemorrhoids that do not prolapse). Impression:            - One 7 mm polyp in the sigmoid colon, removed with a                         cold snare. Resected and retrieved.                        - Two 3 to 6 mm polyps in the descending colon,                         removed with a cold snare. Resected and retrieved.                        - Non-bleeding internal hemorrhoids. Recommendation:        - Discharge patient to home.                        - Resume previous diet.                        - Continue present medications.                        - Await pathology results.                        -  If the pathology report reveals adenomatous tissue,                         then repeat the colonoscopy for surveillance in 7                         years. Procedure Code(s):     --- Professional ---                        [phone], Colonoscopy, flexible; with removal of                         tumor(s), polyp(s), or other lesion(s) by snare                         technique Diagnosis Code(s):      --- Professional ---                        Z12.11, Encounter for screening for malignant neoplasm                         of colon                        D12.5, Benign neoplasm of sigmoid colon CPT copyright 2022 American Medical Association. All rights reserved. The codes documented in this report are preliminary and upon coder review may  be revised to meet current compliance requirements. Midge Minium MD, MD 10/27/2022 8:47:51 AM This report has been signed electronically. Number of Addenda: 0 Note Initiated On: 10/27/2022 8:13 AM Scope Withdrawal Time: 0 hours 12 minutes 46 seconds  Total Procedure Duration: 0 hours 14 minutes 23 seconds  Estimated Blood Loss:  Estimated blood loss: none.      [facility]

## 2022-10-27 NOTE — Transfer of Care (Signed)
Immediate Anesthesia Transfer of Care Note  Patient: Duane Thompson  Procedure(s) Performed: COLONOSCOPY WITH PROPOFOL  Patient Location: PACU  Anesthesia Type:General  Level of Consciousness: drowsy  Airway & Oxygen Therapy: Patient Spontanous Breathing  Post-op Assessment: Report given to RN and Post -op Vital signs reviewed and stable  Post vital signs: stable  Last Vitals:  Vitals Value Taken Time  BP 105/67 10/27/22 0849  Temp    Pulse 79 10/27/22 0849  Resp 15 10/27/22 0849  SpO2 97 % 10/27/22 0849  Vitals shown include unfiled device data.  Last Pain:  Vitals:   10/27/22 0751  TempSrc: Temporal  PainSc: 0-No pain         Complications: No notable events documented.

## 2022-10-28 ENCOUNTER — Encounter: Payer: Self-pay | Admitting: Gastroenterology

## 2022-10-28 LAB — SURGICAL PATHOLOGY

## 2022-10-29 ENCOUNTER — Encounter: Payer: Self-pay | Admitting: Gastroenterology

## 2022-11-03 ENCOUNTER — Ambulatory Visit: Payer: BC Managed Care – PPO | Admitting: Family Medicine

## 2022-11-04 NOTE — Anesthesia Postprocedure Evaluation (Signed)
Anesthesia Post Note  Patient: Duane Thompson  Procedure(s) Performed: COLONOSCOPY WITH PROPOFOL POLYPECTOMY  Patient location during evaluation: Endoscopy Anesthesia Type: General Level of consciousness: awake and alert Pain management: pain level controlled Vital Signs Assessment: post-procedure vital signs reviewed and stable Respiratory status: spontaneous breathing, nonlabored ventilation, respiratory function stable and patient connected to nasal cannula oxygen Cardiovascular status: blood pressure returned to baseline and stable Postop Assessment: no apparent nausea or vomiting Anesthetic complications: no   There were no known notable events for this encounter.   Last Vitals:  Vitals:   10/27/22 0751 10/27/22 0849  BP: (!) 151/93 105/67  Pulse: 63 79  Resp: 18 16  Temp: (!) 35.8 C (!) 35.7 C  SpO2: 100% 98%    Last Pain:  Vitals:   10/28/22 0740  TempSrc:   PainSc: 0-No pain                 Lenard Simmer

## 2023-03-28 ENCOUNTER — Other Ambulatory Visit: Payer: Self-pay | Admitting: Family Medicine

## 2023-03-28 DIAGNOSIS — I10 Essential (primary) hypertension: Secondary | ICD-10-CM

## 2023-03-29 NOTE — Telephone Encounter (Signed)
Called pt and made follow up appointment. ?

## 2023-03-29 NOTE — Telephone Encounter (Signed)
 Requested Prescriptions  Pending Prescriptions Disp Refills   amLODipine  (NORVASC ) 10 MG tablet [Pharmacy Med Name: AMLODIPINE  BESYLATE 10 MG TAB] 90 tablet 0    Sig: TAKE 1 TAB BY MOUTH DAILY     Cardiovascular: Calcium Channel Blockers 2 Failed - 03/29/2023  4:15 PM      Failed - Valid encounter within last 6 months    Recent Outpatient Visits           6 months ago Primary hypertension   [place] [facility] Kayla Part, MD   1 year ago Primary hypertension   [place] [facility] Part, MD   1 year ago Primary hypertension   [place] [facility] Part, MD   1 year ago Primary hypertension   [place] [facility] Part, MD   1 year ago Primary hypertension   [place] [facility] Part, MD       Future Appointments             Tomorrow Clarise Crooks, MD [facility]            Passed - Last BP in normal range    BP Readings from Last 1 Encounters:  10/27/22 105/67         Passed - Last Heart Rate in normal range    Pulse Readings from Last 1 Encounters:  10/27/22 79

## 2023-03-30 ENCOUNTER — Ambulatory Visit: Payer: BC Managed Care – PPO | Admitting: Family Medicine

## 2023-03-30 ENCOUNTER — Encounter: Payer: Self-pay | Admitting: Family Medicine

## 2023-03-30 DIAGNOSIS — I10 Essential (primary) hypertension: Secondary | ICD-10-CM | POA: Diagnosis not present

## 2023-03-30 MED ORDER — AMLODIPINE BESYLATE 10 MG PO TABS: 10.0000 mg | ORAL_TABLET | Freq: Every day | ORAL | 1 refills | Status: AC

## 2023-03-30 MED ORDER — VALSARTAN 160 MG PO TABS
160.0000 mg | ORAL_TABLET | Freq: Every day | ORAL | 1 refills | Status: DC
Start: 2023-03-30 — End: 2023-10-05

## 2023-03-30 NOTE — Progress Notes (Signed)
Date:  03/30/2023   Name:  Duane Thompson   DOB:  [birth year]   MRN:  [id number]   Chief Complaint: Hypertension  hypertension  Hypertension This is a chronic problem. The current episode started more than 1 year ago. The problem has been gradually improving since onset. The problem is controlled. Pertinent negatives include no anxiety, blurred vision, chest pain, headaches, malaise/fatigue, orthopnea, palpitations, peripheral edema, PND, shortness of breath or sweats. There are no associated agents to hypertension. Past treatments include calcium channel blockers and angiotensin blockers. The current treatment provides moderate improvement. There are no compliance problems.  There is no history of CAD/MI or CVA. There is no history of chronic renal disease, a hypertension causing med or renovascular disease.    Lab Results  Component Value Date   NA 139 09/11/2022   K 4.6 09/11/2022   CO2 24 09/11/2022   GLUCOSE 93 09/11/2022   BUN 12 09/11/2022   CREATININE 1.07 09/11/2022   CALCIUM 9.0 09/11/2022   EGFR 87 09/11/2022   Lab Results  Component Value Date   CHOL 154 09/11/2022   HDL 35 (L) 09/11/2022   LDLCALC 91 09/11/2022   TRIG 163 (H) 09/11/2022   No results found for: "TSH" No results found for: "HGBA1C" No results found for: "WBC", "HGB", "HCT", "MCV", "PLT" Lab Results  Component Value Date   ALT 40 09/11/2022   AST 24 09/11/2022   ALKPHOS 65 09/11/2022   BILITOT 0.8 09/11/2022   No results found for: "25OHVITD2", "25OHVITD3", "VD25OH"   Review of Systems  Constitutional:  Negative for malaise/fatigue.  HENT:  Negative for congestion and drooling.   Eyes:  Negative for blurred vision.  Respiratory:  Negative for cough, chest tightness, shortness of breath and wheezing.   Cardiovascular:  Negative for chest pain, palpitations, orthopnea and PND.  Gastrointestinal:  Negative for abdominal pain.  Endocrine: Negative for polydipsia and polyuria.   Genitourinary:  Negative for dysuria, frequency and hematuria.  Neurological:  Negative for headaches.    Patient Active Problem List   Diagnosis Date Noted   Encounter for screening colonoscopy 10/27/2022   Polyp of ascending colon 10/27/2022    Allergies  Allergen Reactions   Amoxicillin Hives    Did it involve swelling of the face/tongue/throat, SOB, or low BP? No Did it involve sudden or severe rash/hives, skin peeling, or any reaction on the inside of your mouth or nose? No Did you need to seek medical attention at a hospital or doctor's office? No When did it last happen?    unknown   If all above answers are "NO", may proceed with cephalosporin use.     Past Surgical History:  Procedure Laterality Date   APPENDECTOMY     COLONOSCOPY WITH PROPOFOL N/A 10/27/2022   Procedure: COLONOSCOPY WITH PROPOFOL;  Surgeon: Midge Minium, MD;  Location: [facility] ENDOSCOPY;  Service: Endoscopy;  Laterality: N/A;   I & D EXTREMITY Right 06/16/2018   Procedure: IRRIGATION AND DEBRIDEMENT AND REVISION OF PARTIAL  AMPUTATION  RIGHT INDEX FINGER;  Surgeon: Betha Loa, MD;  Location: [facility] OR;  Service: Orthopedics;  Laterality: Right;   POLYPECTOMY  10/27/2022   Procedure: POLYPECTOMY;  Surgeon: Midge Minium, MD;  Location[facility] END[facility]COPY;  Service: Endoscopy;;    Social History   Tobacco Use   Smoking status: Never   Smokeless tobacco: Former    Quit date: 02/17/2003  Vaping Use   Vaping status: Never Used  Substance Use Topics   Alcohol  use: Yes    Comment: occas.   Drug use: Never     Medication list has been reviewed and updated.  Current Meds  Medication Sig   amLODipine (NORVASC) 10 MG tablet TAKE 1 TAB BY MOUTH DAILY   valsartan (DIOVAN) 160 MG tablet Take 1 tablet (160 mg total) by mouth daily.       03/30/2023    3:49 PM 09/11/2022    8:55 AM 03/13/2022    3:56 PM 02/10/2022    4:17 PM  GAD 7 : Generalized Anxiety Score  Nervous, Anxious, on Edge 0 0 0 0  Control/stop  worrying 0 0 0 0  Worry too much - different things 0 0 0 0  Trouble relaxing 0 0 0 0  Restless 0 0 0 0  Easily annoyed or irritable 0 0 0 0  Afraid - awful might happen 0 0 0 0  Total GAD 7 Score 0 0 0 0  Anxiety Difficulty Not difficult at all Not difficult at all Not difficult at all Not difficult at all       03/30/2023    3:49 PM 09/11/2022    8:55 AM 03/13/2022    3:56 PM  Depression screen PHQ 2/9  Decreased Interest 0 0 0  Down, Depressed, Hopeless 0 0 0  PHQ - 2 Score 0 0 0  Altered sleeping 0 0 0  Tired, decreased energy 0 0 0  Change in appetite 0 0 0  Feeling bad or failure about yourself  0 0 0  Trouble concentrating 0 0 0  Moving slowly or fidgety/restless 0 0 0  Suicidal thoughts 0 0 0  PHQ-9 Score 0 0 0  Difficult doing work/chores Not difficult at all Not difficult at all Not difficult at all    BP Readings from Last 3 Encounters:  03/30/23 128/70  10/27/22 105/67  09/11/22 (!) 132/92    Physical Exam Vitals and nursing note reviewed.  Constitutional:      Appearance: He is well-developed.  HENT:     Head: Normocephalic and atraumatic.     Right Ear: Tympanic membrane, ear canal and external ear normal.     Left Ear: Tympanic membrane, ear canal and external ear normal.     Nose: Nose normal.     Mouth/Throat:     Mouth: Mucous membranes are moist.     Dentition: Normal dentition.  Eyes:     General: Lids are normal. No scleral icterus.    Conjunctiva/sclera: Conjunctivae normal.     Pupils: Pupils are equal, round, and reactive to light.  Neck:     Thyroid: No thyromegaly.     Vascular: No carotid bruit, hepatojugular reflux or JVD.     Trachea: No tracheal deviation.  Cardiovascular:     Rate and Rhythm: Normal rate and regular rhythm.     Heart sounds: Normal heart sounds. No murmur heard.    No gallop.  Pulmonary:     Effort: Pulmonary effort is normal.     Breath sounds: Normal breath sounds. No wheezing, rhonchi or rales.  Abdominal:      General: Bowel sounds are normal.     Palpations: Abdomen is soft. There is no hepatomegaly, splenomegaly or mass.     Tenderness: There is no abdominal tenderness.     Hernia: There is no hernia in the left inguinal area.  Musculoskeletal:        General: Normal range of motion.     Cervical back:  Normal range of motion and neck supple.  Lymphadenopathy:     Cervical: No cervical adenopathy.  Skin:    General: Skin is warm and dry.     Findings: No rash.  Neurological:     Mental Status: He is alert and oriented to person, place, and time.     Sensory: No sensory deficit.     Deep Tendon Reflexes: Reflexes are normal and symmetric.  Psychiatric:        Mood and Affect: Mood is not anxious or depressed.     Wt Readings from Last 3 Encounters:  03/30/23 247 lb (112 kg)  10/27/22 232 lb 12.8 oz (105.6 kg)  09/11/22 241 lb (109.3 kg)    BP 128/70   Pulse 97   Ht 5\' 10"  (1.778 m)   Wt 247 lb (112 kg)   SpO2 98%   BMI 35.44 kg/m   Assessment and Plan: 1. Primary hypertension Chronic.  Controlled.  Stable.  Asymptomatic.  Blood pressure 128/70.  Tolerating medications well.  Rare occasions there is some swelling in the feet may be from the amlodipine but is not on a daily basis.  Continue valsartan 160 mg once a day and amlodipine 10 mg once a day.  Review of previous renal panel is acceptable.  Will recheck in 6 months.  Meantime I be reemphasized low-sodium dietary guidelines. - valsartan (DIOVAN) 160 MG tablet; Take 1 tablet (160 mg total) by mouth daily.  Dispense: 90 tablet; Refill: 1 - amLODipine (NORVASC) 10 MG tablet; Take 1 tablet (10 mg total) by mouth daily.  Dispense: 90 tablet; Refill: 1     Elizabeth Sauer, MD

## 2023-10-05 ENCOUNTER — Ambulatory Visit (INDEPENDENT_AMBULATORY_CARE_PROVIDER_SITE_OTHER): Admitting: Family Medicine

## 2023-10-05 ENCOUNTER — Encounter: Payer: Self-pay | Admitting: Family Medicine

## 2023-10-05 VITALS — BP 140/94 | HR 75 | Ht 70.0 in | Wt 244.0 lb

## 2023-10-05 DIAGNOSIS — Z6835 Body mass index (BMI) 35.0-35.9, adult: Secondary | ICD-10-CM | POA: Diagnosis not present

## 2023-10-05 DIAGNOSIS — Z0000 Encounter for general adult medical examination without abnormal findings: Secondary | ICD-10-CM | POA: Diagnosis not present

## 2023-10-05 DIAGNOSIS — E785 Hyperlipidemia, unspecified: Secondary | ICD-10-CM | POA: Diagnosis not present

## 2023-10-05 DIAGNOSIS — I10 Essential (primary) hypertension: Secondary | ICD-10-CM | POA: Diagnosis not present

## 2023-10-05 MED ORDER — VALSARTAN 160 MG PO TABS
160.0000 mg | ORAL_TABLET | Freq: Every day | ORAL | 3 refills | Status: AC
Start: 2023-10-05 — End: ?

## 2023-10-05 MED ORDER — AMLODIPINE BESYLATE-VALSARTAN 10-160 MG PO TABS: 1.0000 | ORAL_TABLET | Freq: Every day | ORAL | 3 refills | Status: DC

## 2023-10-05 NOTE — Assessment & Plan Note (Signed)
 Annual examination completed, risk stratification labs ordered, anticipatory guidance provided.  We will follow labs once resulted.

## 2023-10-05 NOTE — Assessment & Plan Note (Signed)
 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 Discussed weight loss benefits on blood pressure and cholesterol. - Encourage small, sustainable dietary changes for weight management. - Discuss weight loss benefits on reducing antihypertensive medications.

## 2023-10-05 NOTE — Assessment & Plan Note (Addendum)
 Hypertension management - Currently taking amlodipine , which was refilled at this visit - Unable to refill losartan due to authorization issues - Blood pressure tends to rise during stressful situations, such as busy days - Family history of hypertension in both parents   Hypertension Amlodipine  causing ankle edema. Losartan previously effective. Discussed weight loss to reduce medication need, considering genetic factors. - Refill losartan 100 mg. - Prescribe Exforge  (amlodipine  10 mg/valsartan  160 mg) to [facility] in [place].  Can utilize this if covered in place of 2 separate pills. - Prescribe valsartan  to [facility] in [place]. - Instruct to monitor blood pressure regularly after starting new regimen. - Schedule nurse visit if blood pressure is not well-controlled.

## 2023-10-05 NOTE — Patient Instructions (Addendum)
-   Obtain fasting labs with orders provided (can have water or black coffee but otherwise no food or drink x 8 hours before labs) - Review information provided - Attend eye doctor annually, dentist every 6 months, work towards or maintain 30 minutes of moderate intensity physical activity at least 5 days per week, and consume a balanced diet - Return in 1 year for physical - Contact us  for any questions between now and then   Patient Plan for Post-Visit Guidance  - Refill and take losartan 100 mg as prescribed. - Use Exforge  (amlodipine /valsartan ) if covered by insurance, or continue valsartan  and amlodipine  separately as prescribed. - Schedule a nurse visit if blood pressure remains high or is not well-controlled. - Make small, sustainable dietary changes to help with weight management and cholesterol. - Follow dietary recommendations to help lower cholesterol and blood pressure. - Aim for gradual weight loss, as this can help reduce the need for blood pressure medications. - Wear compression socks or elevate legs if you notice ankle swelling from amlodipine . - Follow up after lab results are available to review and discuss next steps.  Red Flags - If you experience severe headache, chest pain, shortness of breath, vision changes, or swelling in your legs that does not improve, seek medical attention promptly.

## 2023-10-05 NOTE — Assessment & Plan Note (Signed)
 Hyperlipidemia - History of elevated cholesterol  Hyperlipidemia Cholesterol levels not significantly high but increase cardiovascular risk with hypertension. Emphasized lifestyle modifications for cholesterol and blood pressure improvement. - Order routine blood work including cholesterol panel. - Discuss dietary modifications to aid in weight management and reduce cholesterol levels.

## 2023-10-05 NOTE — Progress Notes (Signed)
 Annual Physical Exam Visit  Patient Information:  Patient ID: Donaciano Range, male DOB: 03-03-1975 Age: 48 y.o. MRN: [id number]   Subjective:   CC: Annual Physical Exam  HPI:  Duane Thompson is here for their annual physical.  I reviewed the past medical history, family history, social history, surgical history, and allergies today and changes were made as necessary.  Please see the problem list section below for additional details.  Past Medical History: Past Medical History:  Diagnosis Date   Hypertension    Past Surgical History: Past Surgical History:  Procedure Laterality Date   APPENDECTOMY     COLONOSCOPY WITH PROPOFOL  N/A 10/27/2022   Procedure: COLONOSCOPY WITH PROPOFOL ;  Surgeon: Jinny Carmine, MD;  Location: [facility] ENDOSCOPY;  Service: Endoscopy;  Laterality: N/A;   I & D EXTREMITY Right 06/16/2018   Procedure: IRRIGATION AND DEBRIDEMENT AND REVISION OF PARTIAL  AMPUTATION  RIGHT INDEX FINGER;  Surgeon: Murrell Drivers, MD;  Location: [facility] OR;  Service: Orthopedics;  Laterality: Right;   POLYPECTOMY  10/27/2022   Procedure: POLYPECTOMY;  Surgeon: Jinny Carmine, MD;  Location: [facility] ENDOSCOPY;  Service: Endoscopy;;   Family History: History reviewed. No pertinent family history. Allergies: Allergies  Allergen Reactions   Amoxicillin Hives    Did it involve swelling of the face/tongue/throat, SOB, or low BP? No Did it involve sudden or severe rash/hives, skin peeling, or any reaction on the inside of your mouth or nose? No Did you need to seek medical attention at a hospital or doctor's office? No When did it last happen?    unknown   If all above answers are "NO", may proceed with cephalosporin use.    Health Maintenance: Health Maintenance  Topic Date Due   HIV Screening  Never done   Hepatitis C Screening  Never done   Hepatitis B Vaccines 19-59 Average Risk (1 of 3 - 19+ 3-dose series) Never done   COVID-19 Vaccine (3 - 2024-25 season) 10/18/2022    INFLUENZA VACCINE  09/17/2023   DTaP/Tdap/Td (2 - Td or Tdap) 06/15/2028   Colonoscopy  10/26/2029   Pneumococcal Vaccine  Aged Out   HPV VACCINES  Aged Out   Meningococcal B Vaccine  Aged Out    [facility] Colonoscopy          Upcoming     Colonoscopy (Every 7 Years) Next due on 10/26/2029    10/27/2022  COLONOSCOPY   Only the first 1 history entries have been loaded, but more history exists.               Medications: Current Outpatient Medications on File Prior to Visit  Medication Sig Dispense Refill   amLODipine  (NORVASC ) 10 MG tablet Take 1 tablet (10 mg total) by mouth daily. 90 tablet 1   No current facility-administered medications on file prior to visit.    Objective:   Vitals:   10/05/23 1608  BP: (!) 140/94  Pulse: 75  SpO2: 99%   Vitals:   10/05/23 1608  Weight: 244 lb (110.7 kg)  Height: 5' 10 (1.778 m)   Body mass index is 35.01 kg/m.  General: Well Developed, well nourished, and in no acute distress.  Neuro: Alert and oriented x3, extra-ocular muscles intact, sensation grossly intact. Cranial nerves II through XII are grossly intact, motor, sensory, and coordinative functions are intact. HEENT: Normocephalic, atraumatic, neck supple, no masses, no lymphadenopathy, thyroid nonenlarged. Oropharynx, nasopharynx, external ear canals are unremarkable. Skin: Warm and dry, no rashes noted.  Cardiac: Regular rate and rhythm, no murmurs rubs or gallops. No peripheral edema. Pulses symmetric. Respiratory: Clear to auscultation bilaterally. Speaking in full sentences.  Abdominal: Soft, nontender, nondistended, positive bowel sounds, no masses, no organomegaly. Musculoskeletal: Stable, and with full range of motion.  Impression and Recommendations:   The patient was counselled, risk factors were discussed, and anticipatory guidance given.  Problem List Items Addressed This Visit     Healthcare maintenance - Primary       Annual examination completed,  risk stratification labs ordered, anticipatory guidance provided.  We will follow labs once resulted.     Relevant Orders   CBC   Comprehensive metabolic panel with GFR   Hemoglobin A1c   Lipid panel   PSA Total (Reflex To Free)   Hepatitis C antibody   HIV Antibody (routine testing w rflx)   Hyperlipidemia   Hyperlipidemia - History of elevated cholesterol  Hyperlipidemia Cholesterol levels not significantly high but increase cardiovascular risk with hypertension. Emphasized lifestyle modifications for cholesterol and blood pressure improvement. - Order routine blood work including cholesterol panel. - Discuss dietary modifications to aid in weight management and reduce cholesterol levels.      Relevant Medications   amLODipine -valsartan  (EXFORGE ) 10-160 MG tablet   valsartan  (DIOVAN ) 160 MG tablet   Primary hypertension   Hypertension management - Currently taking amlodipine , which was refilled at this visit - Unable to refill losartan due to authorization issues - Blood pressure tends to rise during stressful situations, such as busy days - Family history of hypertension in both parents  Hypertension Amlodipine  causing ankle edema. Losartan previously effective. Discussed weight loss to reduce medication need, considering genetic factors. - Refill losartan 100 mg. - Prescribe Exforge  (amlodipine  10 mg/valsartan  160 mg) to [facility] [place].  Can utilize this if covered in place of 2 separate pills. - Prescribe valsartan  to [facility] in [place]. - Instruct to monitor blood pressure regularly after starting new regimen. - Schedule nurse visit if blood pressure is not well-controlled.      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 Discussed weight loss benefits on blood pressure and cholesterol. - Encourage small, sustainable dietary changes for weight management. - Discuss weight loss benefits on reducing antihypertensive medications.         Orders & Medications Medications:  Meds ordered this encounter  Medications   amLODipine -valsartan  (EXFORGE ) 10-160 MG tablet    Sig: Take 1 tablet by mouth daily.    Dispense:  90 tablet    Refill:  3   valsartan  (DIOVAN ) 160 MG tablet    Sig: Take 1 tablet (160 mg total) by mouth daily.    Dispense:  90 tablet    Refill:  3   Orders Placed This Encounter  Procedures   CBC   Comprehensive metabolic panel with GFR   Hemoglobin A1c   Lipid panel   PSA Total (Reflex To Free)   Hepatitis C antibody   HIV Antibody (routine testing w rflx)     No follow-ups on file.    Selinda JINNY Ku, MD, [facility]   Primary Care Sports Medicine Primary Care and Sports Medicine  at [facility] [place]

## 2023-10-08 ENCOUNTER — Ambulatory Visit: Payer: Self-pay | Admitting: Family Medicine

## 2023-10-08 LAB — HEMOGLOBIN A1C
Est. average glucose Bld gHb Est-mCnc: 91 mg/dL
Hgb A1c MFr Bld: 4.8 % (ref 4.8–5.6)

## 2023-10-08 LAB — COMPREHENSIVE METABOLIC PANEL WITH GFR
ALT: 46 IU/L — ABNORMAL HIGH (ref 0–44)
AST: 27 IU/L (ref 0–40)
Albumin: 5 g/dL (ref 4.1–5.1)
Alkaline Phosphatase: 81 IU/L (ref 44–121)
BUN/Creatinine Ratio: 12 (ref 9–20)
BUN: 12 mg/dL (ref 6–24)
Bilirubin Total: 0.8 mg/dL (ref 0.0–1.2)
CO2: 23 mmol/L (ref 20–29)
Calcium: 9.7 mg/dL (ref 8.7–10.2)
Chloride: 101 mmol/L (ref 96–106)
Creatinine, Ser: 1.02 mg/dL (ref 0.76–1.27)
Globulin, Total: 2.5 g/dL (ref 1.5–4.5)
Glucose: 94 mg/dL (ref 70–99)
Potassium: 4.5 mmol/L (ref 3.5–5.2)
Sodium: 138 mmol/L (ref 134–144)
Total Protein: 7.5 g/dL (ref 6.0–8.5)
eGFR: 92 mL/min/1.73 (ref >59)

## 2023-10-08 LAB — LIPID PANEL
Chol/HDL Ratio: 4.2 ratio (ref 0.0–5.0)
Cholesterol, Total: 169 mg/dL (ref 100–199)
HDL: 40 mg/dL (ref >39)
LDL Chol Calc (NIH): 95 mg/dL (ref 0–99)
Triglycerides: 201 mg/dL — ABNORMAL HIGH (ref 0–149)
VLDL Cholesterol Cal: 34 mg/dL (ref 5–40)

## 2023-10-08 LAB — CBC
Hematocrit: 44.4 % (ref 37.5–51.0)
Hemoglobin: 15.5 g/dL (ref 13.0–17.7)
MCH: 32.6 pg (ref 26.6–33.0)
MCHC: 34.9 g/dL (ref 31.5–35.7)
MCV: 94 fL (ref 79–97)
Platelets: 213 x10E3/uL (ref 150–450)
RBC: 4.75 x10E6/uL (ref 4.14–5.80)
RDW: 13.1 % (ref 11.6–15.4)
WBC: 6.2 x10E3/uL (ref 3.4–10.8)

## 2023-10-08 LAB — HIV ANTIBODY (ROUTINE TESTING W REFLEX): HIV Screen 4th Generation wRfx: NONREACTIVE

## 2023-10-08 LAB — HEPATITIS C ANTIBODY: Hep C Virus Ab: NONREACTIVE

## 2023-10-08 LAB — PSA TOTAL (REFLEX TO FREE): Prostate Specific Ag, Serum: 1.1 ng/mL (ref 0.0–4.0)

## 2024-01-03 ENCOUNTER — Other Ambulatory Visit: Payer: Self-pay

## 2024-01-03 DIAGNOSIS — I10 Essential (primary) hypertension: Secondary | ICD-10-CM

## 2024-01-03 MED ORDER — AMLODIPINE BESYLATE-VALSARTAN 10-160 MG PO TABS: 1.0000 | ORAL_TABLET | Freq: Every day | ORAL | 0 refills | Status: AC

## 2024-10-05 ENCOUNTER — Encounter: Admitting: Family Medicine
